# Patient Record
Sex: Female | Born: 1970 | Hispanic: Yes | Marital: Single | Smoking: Never smoker
Health system: Southern US, Community
[De-identification: ages and names within clinical notes are randomized; demographics above are authoritative.]

## PROBLEM LIST (undated history)

## (undated) DIAGNOSIS — I10 Essential (primary) hypertension: Secondary | ICD-10-CM

## (undated) DIAGNOSIS — K529 Noninfective gastroenteritis and colitis, unspecified: Secondary | ICD-10-CM

## (undated) DIAGNOSIS — D649 Anemia, unspecified: Secondary | ICD-10-CM

## (undated) DIAGNOSIS — J189 Pneumonia, unspecified organism: Secondary | ICD-10-CM

## (undated) DIAGNOSIS — R002 Palpitations: Secondary | ICD-10-CM

## (undated) DIAGNOSIS — R7303 Prediabetes: Secondary | ICD-10-CM

## (undated) DIAGNOSIS — F419 Anxiety disorder, unspecified: Secondary | ICD-10-CM

## (undated) DIAGNOSIS — R200 Anesthesia of skin: Secondary | ICD-10-CM

---

## 2020-02-29 DIAGNOSIS — A409 Streptococcal sepsis, unspecified: Secondary | ICD-10-CM

## 2020-02-29 HISTORY — DX: Streptococcal sepsis, unspecified: A40.9

## 2020-03-02 ENCOUNTER — Emergency Department (HOSPITAL_COMMUNITY)
Admission: EM | Admit: 2020-03-02 | Discharge: 2020-03-02 | Disposition: A | Discharge status: Home / Self Care | Payer: Self-pay | Attending: Emergency Medicine | Admitting: Emergency Medicine

## 2020-03-02 ENCOUNTER — Emergency Department (HOSPITAL_COMMUNITY): Payer: Self-pay

## 2020-03-02 ENCOUNTER — Encounter (HOSPITAL_COMMUNITY): Payer: Self-pay

## 2020-03-02 DIAGNOSIS — Z79899 Other long term (current) drug therapy: Secondary | ICD-10-CM | POA: Insufficient documentation

## 2020-03-02 DIAGNOSIS — Z7982 Long term (current) use of aspirin: Secondary | ICD-10-CM | POA: Insufficient documentation

## 2020-03-02 DIAGNOSIS — N201 Calculus of ureter: Secondary | ICD-10-CM | POA: Insufficient documentation

## 2020-03-02 HISTORY — DX: Noninfective gastroenteritis and colitis, unspecified: K52.9

## 2020-03-02 LAB — COMPREHENSIVE METABOLIC PANEL
ALT: 16 U/L (ref 0–44)
AST: 26 U/L (ref 15–41)
Albumin: 3.6 g/dL (ref 3.5–5.0)
Alkaline Phosphatase: 68 U/L (ref 38–126)
Anion gap: 14 (ref 5–15)
BUN: 10 mg/dL (ref 6–20)
CO2: 23 mmol/L (ref 22–32)
Calcium: 8.9 mg/dL (ref 8.9–10.3)
Chloride: 100 mmol/L (ref 98–111)
Creatinine, Ser: 0.75 mg/dL (ref 0.44–1.00)
GFR calc Af Amer: >60 mL/min (ref >60)
GFR calc non Af Amer: >60 mL/min (ref >60)
Glucose, Bld: 133 mg/dL — ABNORMAL HIGH (ref 70–99)
Potassium: 3.4 mmol/L — ABNORMAL LOW (ref 3.5–5.1)
Sodium: 137 mmol/L (ref 135–145)
Total Bilirubin: 0.6 mg/dL (ref 0.3–1.2)
Total Protein: 7.6 g/dL (ref 6.5–8.1)

## 2020-03-02 LAB — I-STAT BETA HCG BLOOD, ED (MC, WL, AP ONLY): I-stat hCG, quantitative: <5 m[IU]/mL (ref <5)

## 2020-03-02 LAB — CBC
HCT: 35.5 % — ABNORMAL LOW (ref 36.0–46.0)
Hemoglobin: 11.2 g/dL — ABNORMAL LOW (ref 12.0–15.0)
MCH: 25.5 pg — ABNORMAL LOW (ref 26.0–34.0)
MCHC: 31.5 g/dL (ref 30.0–36.0)
MCV: 80.9 fL (ref 80.0–100.0)
Platelets: 313 10*3/uL (ref 150–400)
RBC: 4.39 MIL/uL (ref 3.87–5.11)
RDW: 17.1 % — ABNORMAL HIGH (ref 11.5–15.5)
WBC: 10.7 10*3/uL — ABNORMAL HIGH (ref 4.0–10.5)
nRBC: 0 % (ref 0.0–0.2)

## 2020-03-02 LAB — URINALYSIS, ROUTINE W REFLEX MICROSCOPIC
Bilirubin Urine: NEGATIVE
Glucose, UA: NEGATIVE mg/dL
Ketones, ur: NEGATIVE mg/dL
Nitrite: NEGATIVE
Protein, ur: 30 mg/dL — AB
Specific Gravity, Urine: 1.015 (ref 1.005–1.030)
pH: 8 (ref 5.0–8.0)

## 2020-03-02 LAB — URINALYSIS, MICROSCOPIC (REFLEX): RBC / HPF: >50 RBC/hpf (ref 0–5)

## 2020-03-02 LAB — LIPASE, BLOOD: Lipase: 20 U/L (ref 11–51)

## 2020-03-02 MED ORDER — KETOROLAC TROMETHAMINE 15 MG/ML IJ SOLN
15.0000 mg | Freq: Once | INTRAMUSCULAR | Status: AC
  Administered 2020-03-02: 15 mg via INTRAVENOUS
  Filled 2020-03-02: qty 1

## 2020-03-02 MED ORDER — MORPHINE SULFATE (PF) 4 MG/ML IV SOLN
4.0000 mg | Freq: Once | INTRAVENOUS | Status: AC
  Administered 2020-03-02: 4 mg via INTRAVENOUS
  Filled 2020-03-02: qty 1

## 2020-03-02 MED ORDER — IOHEXOL 300 MG/ML  SOLN
100.0000 mL | Freq: Once | INTRAMUSCULAR | Status: AC | PRN
  Administered 2020-03-02: 100 mL via INTRAVENOUS

## 2020-03-02 MED ORDER — ONDANSETRON HCL 4 MG/2ML IJ SOLN
4.0000 mg | Freq: Once | INTRAMUSCULAR | Status: AC
  Administered 2020-03-02: 4 mg via INTRAVENOUS
  Filled 2020-03-02: qty 2

## 2020-03-02 MED ORDER — CEPHALEXIN 500 MG PO CAPS
500.0000 mg | ORAL_CAPSULE | Freq: Four times a day (QID) | ORAL | 0 refills | Status: DC
Start: 2020-03-02 — End: 2020-03-07

## 2020-03-02 MED ORDER — OXYCODONE-ACETAMINOPHEN 5-325 MG PO TABS: 2.0000 | ORAL_TABLET | ORAL | 0 refills | Status: AC | PRN

## 2020-03-02 MED ORDER — TAMSULOSIN HCL 0.4 MG PO CAPS: 0.4000 mg | ORAL_CAPSULE | Freq: Every day | ORAL | 0 refills | Status: AC

## 2020-03-02 MED ORDER — ONDANSETRON 4 MG PO TBDP
4.0000 mg | ORAL_TABLET | Freq: Three times a day (TID) | ORAL | 0 refills | Status: AC | PRN
Start: 2020-03-02 — End: ?

## 2020-03-02 NOTE — ED Provider Notes (Signed)
[facility] EMERGENCY DEPARTMENT Provider Note   CSN: [id number] Arrival date & time: 03/02/20  0946     History Chief Complaint  Patient presents with  . Abdominal Pain    Meghan [name] is a 49 y.o. female.  49 y/o F with hx of colitis presenting to the ER for 2 days of worsening LLQ pain and L flank pain with n/v. Denies fever, chills, cough. Meghan [name] reports hx of the same in the past and was told Meghan [name] had colitis. Meghan [name] is not having diarrhea. Reports being on her normal menstrual cycle. No dysuria and unclear if hematuria given her menstrual cycle.         Past Medical History:  Diagnosis Date  . Colitis     There are no problems to display for this patient.   History reviewed. No pertinent surgical history.   OB History   No obstetric history on file.     No family history on file.  Social History   Tobacco Use  . Smoking status: Not on file  Substance Use Topics  . Alcohol use: Not on file  . Drug use: Not on file    Home Medications Prior to Admission medications   Medication Sig Start Date End Date Taking? Authorizing Provider  aspirin EC 81 MG tablet Take 81 mg by mouth daily.   Yes [provider]  bisoprolol (ZEBETA) 10 MG tablet Take 10 mg by mouth daily.   Yes [provider]  clonazePAM (KLONOPIN) 2 MG tablet Take 2 mg by mouth 2 (two) times daily.   Yes [provider]  losartan-hydrochlorothiazide (HYZAAR) 100-25 MG tablet Take 1 tablet by mouth daily.   Yes [provider]  omeprazole (PRILOSEC) 20 MG capsule Take 20 mg by mouth daily.   Yes [provider]  OVER THE COUNTER MEDICATION Take 1 tablet by mouth daily. Enantyum Plus   Yes [provider]  pantoprazole (PROTONIX) 20 MG tablet Take 20 mg by mouth daily.   Yes [provider]  vitamin B-12 (CYANOCOBALAMIN) 500 MCG tablet Take 500 mcg by mouth daily.   Yes [provider]    Allergies    Patient has  no known allergies.  Review of Systems   Review of Systems  Constitutional: Negative for appetite change, chills and fever.  HENT: Negative for congestion.   Respiratory: Negative for cough and shortness of breath.   Cardiovascular: Negative for chest pain.  Gastrointestinal: Positive for abdominal pain, nausea and vomiting. Negative for constipation and diarrhea.  Genitourinary: Positive for vaginal bleeding (reoprts on her normal menstration currently). Negative for decreased urine volume, dysuria, genital sores, hematuria, menstrual problem, pelvic pain and vaginal discharge.  Musculoskeletal: Negative for back pain.  Skin: Negative for rash.  Neurological: Negative for dizziness.  All other systems reviewed and are negative.   Physical Exam Updated Vital Signs BP 126/61   Pulse 93   Temp 98.6 F (37 C) (Oral)   Resp 18   Ht 5\' 4"  (1.626 m)   Wt 83.9 kg   LMP 03/02/2020   SpO2 (!) 87%   BMI 31.76 kg/m   Physical Exam Vitals and nursing note reviewed.  Constitutional:      General: Meghan [name] is not in acute distress.    Appearance: Normal appearance. Meghan [name] is well-developed. Meghan [name] is not ill-appearing, toxic-appearing or diaphoretic.  HENT:     Head: Normocephalic and atraumatic. No raccoon eyes, Battle's sign, abrasion, contusion, masses or laceration.  Jaw: There is normal jaw occlusion.     Nose: Nose normal.     Mouth/Throat:     Mouth: Mucous membranes are moist.  Eyes:     Conjunctiva/sclera: Conjunctivae normal.  Neck:     Trachea: Trachea normal.  Cardiovascular:     Rate and Rhythm: Normal rate and regular rhythm.  Pulmonary:     Effort: Pulmonary effort is normal.     Breath sounds: Normal breath sounds.  Chest:     Chest wall: No deformity or tenderness.     Comments: No seatbelt sign Abdominal:     General: Abdomen is flat.     Palpations: Abdomen is soft.     Tenderness: There is abdominal tenderness in the left lower quadrant. There is guarding. There  is no right CVA tenderness, left CVA tenderness or rebound. Negative signs include Murphy's sign and Rovsing's sign.     Comments: No seatbelt sign  Musculoskeletal:     Cervical back: Full passive range of motion without pain. No edema. No pain with movement, spinous process tenderness or muscular tenderness.     Thoracic back: Normal.     Lumbar back: Normal.  Skin:    General: Skin is warm and dry.  Neurological:     General: No focal deficit present.     Mental Status: Meghan [name] is alert and oriented to person, place, and time.  Psychiatric:        Mood and Affect: Mood normal.     ED Results / Procedures / Treatments   Labs (all labs ordered are listed, but only abnormal results are displayed) Labs Reviewed  COMPREHENSIVE METABOLIC PANEL - Abnormal; Notable for the following components:      Result Value   Potassium 3.4 (*)    Glucose, Bld 133 (*)    All other components within normal limits  CBC - Abnormal; Notable for the following components:   WBC 10.7 (*)    Hemoglobin 11.2 (*)    HCT 35.5 (*)    MCH 25.5 (*)    RDW 17.1 (*)    All other components within normal limits  URINALYSIS, ROUTINE W REFLEX MICROSCOPIC - Abnormal; Notable for the following components:   APPearance TURBID (*)    Hgb urine dipstick LARGE (*)    Protein, ur 30 (*)    Leukocytes,Ua MODERATE (*)    All other components within normal limits  URINALYSIS, MICROSCOPIC (REFLEX) - Abnormal; Notable for the following components:   Bacteria, UA FEW (*)    All other components within normal limits  LIPASE, BLOOD  I-STAT BETA HCG BLOOD, ED ([facility], [facility], [facility] ONLY)    EKG None  Radiology No results found.  Procedures Procedures (including critical care time)  Medications Ordered in ED Medications  iohexol (OMNIPAQUE) 300 MG/ML solution 100 mL (has no administration in time range)  morphine 4 MG/ML injection 4 mg (4 mg Intravenous Given 03/02/20 1212)  ondansetron (ZOFRAN) injection 4 mg (4 mg Intravenous  Given 03/02/20 1236)  morphine 4 MG/ML injection 4 mg (4 mg Intravenous Given 03/02/20 1328)  ketorolac (TORADOL) 15 MG/ML injection 15 mg (15 mg Intravenous Given 03/02/20 1328)    ED Course  I have reviewed the triage vital signs and the nursing notes.  Pertinent labs & imaging results that were available during my care of the patient were reviewed by me and considered in my medical decision making (see chart for details).  Clinical Course as of Mar 03 1515  Thu  Mar 02, 2020  1427 Patient with history of colitis presenting with the same.  Afebrile but does appear uncomfortable and is actively vomiting.  Has left lower quadrant tenderness.  Meghan [name] was improved with morphine and Zofran.  Work-up revealing questionable UTI.  Has hemoglobin and red blood cells in her urine but it is also on her normal menstrual cycle.  No pelvic pain.  Awaiting CT scan.   [KM]  9150 Care signed to [facility] due to change of shift   [KM]    Clinical Course User Index [KM] Kristine Royal   MDM Rules/Calculators/A&P                       Final Clinical Impression(s) / ED Diagnoses Final diagnoses:  None    Rx / DC Orders ED Discharge Orders    None       Kristine Royal 03/02/20 1516    Veryl Speak, MD 03/03/20 [phone]

## 2020-03-02 NOTE — ED Provider Notes (Signed)
Accepted handoff at shift change from [facility]. Please see prior provider note for more detail.   Briefly: Patient is 49 y.o. patient is a 49 year old female presented today with 2 days of left-sided abdominal pain.  She also endorses flank pain and nausea and vomiting.  Denies any fevers chills cough cold congestion.  She is a history of similar episode that was found to be colitis.  No history of kidney stones.  History of pyelonephritis.  DDX: concern for pyelonephritis, diverticulitis, colitis, kidney stone, [place]  Plan: Follow-up on CT scan.    Physical Exam  BP 117/63   Pulse 97   Temp 98.6 F (37 C) (Oral)   Resp 18   Ht 5\' 4"  (1.626 m)   Wt 83.9 kg   LMP 03/02/2020   SpO2 96%   BMI 31.76 kg/m   CONSTITUTIONAL:  well-appearing, appears very uncomfortable NEURO:  Alert and oriented x 3, no focal deficits EYES:  pupils equal and reactive ENT/NECK:  trachea midline, no JVD CARDIO:  reg rate at rate of 100, reg rhythm, well-perfused PULM:  None labored breathing GI/GU:  Abdomen non-distended, significant tenderness to palpation of left lower quadrant and no CVA tenderness. MSK/SPINE:  No gross deformities, no edema SKIN:  no rash obvious, atraumatic, no ecchymosis  PSYCH:  Appropriate speech and behavior  ED Course/Procedures   Clinical Course as of Mar 03 1751  Thu Mar 02, 2020  1427 Patient with history of colitis presenting with the same.  Afebrile but does appear uncomfortable and is actively vomiting.  Has left lower quadrant tenderness.  She was improved with morphine and Zofran.  Work-up revealing questionable UTI.  Has hemoglobin and red blood cells in her urine but it is also on her normal menstrual cycle.  No pelvic pain.  Awaiting CT scan.   [KM]  5427 Care signed to [facility] due to change of shift   [KM]    Clinical Course User Index [KM] Alveria Apley, PA-C    Procedures Results for orders placed or performed during the hospital encounter of  03/02/20  Lipase, blood  Result Value Ref Range   Lipase 20 11 - 51 U/L  Comprehensive metabolic panel  Result Value Ref Range   Sodium 137 135 - 145 mmol/L   Potassium 3.4 (L) 3.5 - 5.1 mmol/L   Chloride 100 98 - 111 mmol/L   CO2 23 22 - 32 mmol/L   Glucose, Bld 133 (H) 70 - 99 mg/dL   BUN 10 6 - 20 mg/dL   Creatinine, Ser 0.75 0.44 - 1.00 mg/dL   Calcium 8.9 8.9 - 10.3 mg/dL   Total Protein 7.6 6.5 - 8.1 g/dL   Albumin 3.6 3.5 - 5.0 g/dL   AST 26 15 - 41 U/L   ALT 16 0 - 44 U/L   Alkaline Phosphatase 68 38 - 126 U/L   Total Bilirubin 0.6 0.3 - 1.2 mg/dL   GFR calc non Af Amer >60 >60 mL/min   GFR calc Af Amer >60 >60 mL/min   Anion gap 14 5 - 15  CBC  Result Value Ref Range   WBC 10.7 (H) 4.0 - 10.5 K/uL   RBC 4.39 3.87 - 5.11 MIL/uL   Hemoglobin 11.2 (L) 12.0 - 15.0 g/dL   HCT 35.5 (L) 36.0 - 46.0 %   MCV 80.9 80.0 - 100.0 fL   MCH 25.5 (L) 26.0 - 34.0 pg   MCHC 31.5 30.0 - 36.0 g/dL  RDW 17.1 (H) 11.5 - 15.5 %   Platelets 313 150 - 400 K/uL   nRBC 0.0 0.0 - 0.2 %  Urinalysis, Routine w reflex microscopic  Result Value Ref Range   Color, Urine PINK YELLOW   APPearance TURBID (A) CLEAR   Specific Gravity, Urine 1.015 1.005 - 1.030   pH 8.0 5.0 - 8.0   Glucose, UA NEGATIVE NEGATIVE mg/dL   Hgb urine dipstick LARGE (A) NEGATIVE   Bilirubin Urine NEGATIVE NEGATIVE   Ketones, ur NEGATIVE NEGATIVE mg/dL   Protein, ur 30 (A) NEGATIVE mg/dL   Nitrite NEGATIVE NEGATIVE   Leukocytes,Ua MODERATE (A) NEGATIVE  Urinalysis, Microscopic (reflex)  Result Value Ref Range   RBC / HPF >50 0 - 5 RBC/hpf   WBC, UA 21-50 0 - 5 WBC/hpf   Bacteria, UA FEW (A) NONE SEEN   Squamous Epithelial / LPF 0-5 0 - 5  I-Stat beta hCG blood, ED  Result Value Ref Range   I-stat hCG, quantitative <5.0 <5 mIU/mL   Comment 3           CT ABDOMEN PELVIS W CONTRAST  Result Date: 03/02/2020 CLINICAL DATA:  Right lower quadrant abdominal pain radiating to the back for few days. EXAM: CT ABDOMEN  AND PELVIS WITH CONTRAST TECHNIQUE: Multidetector CT imaging of the abdomen and pelvis was performed using the standard protocol following bolus administration of intravenous contrast. CONTRAST:  OMNIPAQUE IOHEXOL 300 MG/ML  SOLN COMPARISON:  None. FINDINGS: Lower chest: No significant pulmonary nodules or acute consolidative airspace disease. Hepatobiliary: Normal liver size. No liver mass. Normal gallbladder with no radiopaque cholelithiasis. No biliary ductal dilatation. Pancreas: Normal, with no mass or duct dilation. Spleen: Normal size. No mass. Adrenals/Urinary Tract: Normal adrenals. Asymmetrically delayed left contrast nephrogram with left perinephric fat stranding and ill-defined fluid. Obstructing 4 mm left ureterovesical junction stone with mild left hydroureteronephrosis. No renal masses. No right hydronephrosis. Normal bladder. Stomach/Bowel: Normal non-distended stomach. Normal caliber small bowel with no small bowel wall thickening. Normal appendix. Normal large bowel with no diverticulosis, large bowel wall thickening or pericolonic fat stranding. Vascular/Lymphatic: Normal caliber abdominal aorta. Patent portal, splenic, hepatic and renal veins. No pathologically enlarged lymph nodes in the abdomen or pelvis. Reproductive: Mildly enlarged and mildly heterogeneous uterus. Simple 1.2 cm left adnexal cyst (series 3/image 74), requiring no follow-up. No right adnexal mass. Other: No pneumoperitoneum, ascites or focal fluid collection. Musculoskeletal: No aggressive appearing focal osseous lesions. Minimal thoracolumbar spondylosis. IMPRESSION: Obstructing 4 mm left UVJ stone with mild left hydroureteronephrosis. Electronically Signed   By: Delbert Phenix M.D.   On: 03/02/2020 16:02    MDM   Patient past medical history and HPI PE above.  Patient has negative for pregnancy.  Lipase within normal limits doubt pancreatitis.  CBC with mild leukocytosis.  Likely secondary to his emesis.  No  significant anemia.  CMP with no sniffing electrolyte abnormalities.  UA does not appear acutely affected however given that she has a kidney stone will treat empirically with antibiotics in the form of Keflex.   I discussed this case with my attending physician who cosigned this note including patient's presenting symptoms, physical exam, and planned diagnostics and interventions. Attending physician stated agreement with plan or made changes to plan which were implemented.   5:52 PM discussed with mark ehlers of urology recommended antibiotic and close follow-up with [facility] tomorrow or Monday.  Patient discharged with Zofran, Percocet, Tylenol Profen recommendations, urine strainer, close urology follow-up tomorrow or  Monday.   Solon Augusta [place], [place] 03/02/20 2119    Alvira Monday, MD 03/03/20 1323

## 2020-03-02 NOTE — ED Notes (Signed)
Pt ambulated to bathroom w standby assist.

## 2020-03-02 NOTE — ED Triage Notes (Signed)
Spanish interpreter used for triage:  Pt reports RLQ pain that radiates to her back for the past few days, hx of colitis. Denies any blood in her stools. Some nausea.

## 2020-03-02 NOTE — Discharge Instructions (Signed)
Please call tomorrow morning to the urology clinic.  They will see you tomorrow or Monday.  Please take antibiotics as prescribed.  Please use Zofran for nausea as needed.  Dissolved under the tongue.  Please use Tylenol or ibuprofen for pain.  You may use 600 mg ibuprofen every 6 hours or 1000 mg of Tylenol every 6 hours.  You may choose to alternate between the 2.  This would be most effective.  Not to exceed 4 g of Tylenol within 24 hours.  Not to exceed 3200 mg ibuprofen 24 hours. Please also use Percocet for pain as needed.

## 2020-03-03 ENCOUNTER — Telehealth: Payer: Self-pay | Admitting: Urology

## 2020-03-03 ENCOUNTER — Inpatient Hospital Stay (HOSPITAL_COMMUNITY): Payer: Self-pay

## 2020-03-03 ENCOUNTER — Inpatient Hospital Stay (HOSPITAL_COMMUNITY)
Admission: EM | Admit: 2020-03-03 | Discharge: 2020-03-07 | DRG: 853 | Disposition: A | Discharge status: Home / Self Care | Payer: Self-pay | Attending: Internal Medicine | Admitting: Internal Medicine

## 2020-03-03 ENCOUNTER — Inpatient Hospital Stay (HOSPITAL_COMMUNITY): Payer: Self-pay | Admitting: Certified Registered Nurse Anesthetist

## 2020-03-03 ENCOUNTER — Encounter (HOSPITAL_COMMUNITY): Payer: Self-pay | Admitting: Emergency Medicine

## 2020-03-03 ENCOUNTER — Emergency Department (HOSPITAL_COMMUNITY): Payer: Self-pay

## 2020-03-03 ENCOUNTER — Encounter (HOSPITAL_COMMUNITY)
Admission: EM | Disposition: A | Discharge status: Home / Self Care | Payer: Self-pay | Source: Home / Self Care | Attending: Internal Medicine

## 2020-03-03 ENCOUNTER — Other Ambulatory Visit: Payer: Self-pay

## 2020-03-03 DIAGNOSIS — E876 Hypokalemia: Secondary | ICD-10-CM | POA: Insufficient documentation

## 2020-03-03 DIAGNOSIS — Z7982 Long term (current) use of aspirin: Secondary | ICD-10-CM

## 2020-03-03 DIAGNOSIS — A419 Sepsis, unspecified organism: Secondary | ICD-10-CM | POA: Diagnosis present

## 2020-03-03 DIAGNOSIS — N136 Pyonephrosis: Secondary | ICD-10-CM | POA: Diagnosis present

## 2020-03-03 DIAGNOSIS — J810 Acute pulmonary edema: Secondary | ICD-10-CM | POA: Diagnosis not present

## 2020-03-03 DIAGNOSIS — N179 Acute kidney failure, unspecified: Secondary | ICD-10-CM | POA: Diagnosis present

## 2020-03-03 DIAGNOSIS — B9620 Unspecified Escherichia coli [E. coli] as the cause of diseases classified elsewhere: Secondary | ICD-10-CM | POA: Diagnosis present

## 2020-03-03 DIAGNOSIS — I10 Essential (primary) hypertension: Secondary | ICD-10-CM | POA: Diagnosis present

## 2020-03-03 DIAGNOSIS — Z79899 Other long term (current) drug therapy: Secondary | ICD-10-CM

## 2020-03-03 DIAGNOSIS — D509 Iron deficiency anemia, unspecified: Secondary | ICD-10-CM | POA: Diagnosis present

## 2020-03-03 DIAGNOSIS — Z419 Encounter for procedure for purposes other than remedying health state, unspecified: Secondary | ICD-10-CM

## 2020-03-03 DIAGNOSIS — R6521 Severe sepsis with septic shock: Secondary | ICD-10-CM

## 2020-03-03 DIAGNOSIS — J9601 Acute respiratory failure with hypoxia: Secondary | ICD-10-CM | POA: Diagnosis not present

## 2020-03-03 DIAGNOSIS — K219 Gastro-esophageal reflux disease without esophagitis: Secondary | ICD-10-CM | POA: Diagnosis present

## 2020-03-03 DIAGNOSIS — A4189 Other specified sepsis: Principal | ICD-10-CM | POA: Diagnosis present

## 2020-03-03 DIAGNOSIS — B964 Proteus (mirabilis) (morganii) as the cause of diseases classified elsewhere: Secondary | ICD-10-CM | POA: Diagnosis present

## 2020-03-03 DIAGNOSIS — A409 Streptococcal sepsis, unspecified: Secondary | ICD-10-CM

## 2020-03-03 DIAGNOSIS — E872 Acidosis: Secondary | ICD-10-CM | POA: Diagnosis present

## 2020-03-03 DIAGNOSIS — R6520 Severe sepsis without septic shock: Secondary | ICD-10-CM | POA: Diagnosis present

## 2020-03-03 DIAGNOSIS — Z20822 Contact with and (suspected) exposure to covid-19: Secondary | ICD-10-CM | POA: Diagnosis present

## 2020-03-03 HISTORY — DX: Essential (primary) hypertension: I10

## 2020-03-03 HISTORY — PX: CYSTOSCOPY WITH RETROGRADE PYELOGRAM, URETEROSCOPY AND STENT PLACEMENT: SHX5789

## 2020-03-03 LAB — BASIC METABOLIC PANEL
Anion gap: 19 — ABNORMAL HIGH (ref 5–15)
BUN: 24 mg/dL — ABNORMAL HIGH (ref 6–20)
CO2: 18 mmol/L — ABNORMAL LOW (ref 22–32)
Calcium: 8.2 mg/dL — ABNORMAL LOW (ref 8.9–10.3)
Chloride: 98 mmol/L (ref 98–111)
Creatinine, Ser: 2.28 mg/dL — ABNORMAL HIGH (ref 0.44–1.00)
GFR calc Af Amer: 28 mL/min — ABNORMAL LOW (ref >60)
GFR calc non Af Amer: 24 mL/min — ABNORMAL LOW (ref >60)
Glucose, Bld: 126 mg/dL — ABNORMAL HIGH (ref 70–99)
Potassium: 2.9 mmol/L — ABNORMAL LOW (ref 3.5–5.1)
Sodium: 135 mmol/L (ref 135–145)

## 2020-03-03 LAB — CBC WITH DIFFERENTIAL/PLATELET
Abs Immature Granulocytes: 0.1 10*3/uL — ABNORMAL HIGH (ref 0.00–0.07)
Basophils Absolute: 0 10*3/uL (ref 0.0–0.1)
Basophils Relative: 0 %
Eosinophils Absolute: 0.1 10*3/uL (ref 0.0–0.5)
Eosinophils Relative: 1 %
HCT: 35.9 % — ABNORMAL LOW (ref 36.0–46.0)
Hemoglobin: 11.1 g/dL — ABNORMAL LOW (ref 12.0–15.0)
Lymphocytes Relative: 9 %
Lymphs Abs: 1.1 10*3/uL (ref 0.7–4.0)
MCH: 25.3 pg — ABNORMAL LOW (ref 26.0–34.0)
MCHC: 30.9 g/dL (ref 30.0–36.0)
MCV: 81.8 fL (ref 80.0–100.0)
Monocytes Absolute: 0.4 10*3/uL (ref 0.1–1.0)
Monocytes Relative: 3 %
Myelocytes: 1 %
Neutro Abs: 10.5 10*3/uL — ABNORMAL HIGH (ref 1.7–7.7)
Neutrophils Relative %: 86 %
Platelets: 168 10*3/uL (ref 150–400)
RBC: 4.39 MIL/uL (ref 3.87–5.11)
RDW: 17.4 % — ABNORMAL HIGH (ref 11.5–15.5)
WBC: 12.2 10*3/uL — ABNORMAL HIGH (ref 4.0–10.5)
nRBC: 0.2 % (ref 0.0–0.2)
nRBC: 2 /100 WBC — ABNORMAL HIGH

## 2020-03-03 LAB — I-STAT BETA HCG BLOOD, ED (MC, WL, AP ONLY): I-stat hCG, quantitative: <5 m[IU]/mL (ref <5)

## 2020-03-03 LAB — PROTIME-INR
INR: 1.6 — ABNORMAL HIGH (ref 0.8–1.2)
Prothrombin Time: 18.6 seconds — ABNORMAL HIGH (ref 11.4–15.2)

## 2020-03-03 LAB — LACTIC ACID, PLASMA
Lactic Acid, Venous: 4.8 mmol/L (ref 0.5–1.9)
Lactic Acid, Venous: 4.6 mmol/L (ref 0.5–1.9)

## 2020-03-03 LAB — SARS CORONAVIRUS 2 BY RT PCR (HOSPITAL ORDER, PERFORMED IN ~~LOC~~ HOSPITAL LAB): SARS Coronavirus 2: NEGATIVE

## 2020-03-03 LAB — APTT: aPTT: 39 seconds — ABNORMAL HIGH (ref 24–36)

## 2020-03-03 SURGERY — CYSTOURETEROSCOPY, WITH RETROGRADE PYELOGRAM AND STENT INSERTION
Anesthesia: General | Site: Bladder | Laterality: Left

## 2020-03-03 MED ORDER — LACTATED RINGERS IV SOLN: INTRAVENOUS | Status: DC

## 2020-03-03 MED ORDER — ONDANSETRON HCL 4 MG/2ML IJ SOLN
4.0000 mg | Freq: Once | INTRAMUSCULAR | Status: AC
  Administered 2020-03-03: 4 mg via INTRAVENOUS
  Filled 2020-03-03: qty 2

## 2020-03-03 MED ORDER — FENTANYL CITRATE (PF) 250 MCG/5ML IJ SOLN
INTRAMUSCULAR | Status: AC
  Filled 2020-03-03: qty 5

## 2020-03-03 MED ORDER — IOHEXOL 300 MG/ML  SOLN
INTRAMUSCULAR | Status: DC | PRN
  Administered 2020-03-03: 20 mL

## 2020-03-03 MED ORDER — MIDAZOLAM HCL 2 MG/2ML IJ SOLN
INTRAMUSCULAR | Status: AC
  Filled 2020-03-03: qty 2

## 2020-03-03 MED ORDER — FENTANYL CITRATE (PF) 100 MCG/2ML IJ SOLN
INTRAMUSCULAR | Status: DC | PRN
  Administered 2020-03-03: 50 ug via INTRAVENOUS

## 2020-03-03 MED ORDER — LACTATED RINGERS IV BOLUS (SEPSIS)
1000.0000 mL | Freq: Once | INTRAVENOUS | Status: AC
  Administered 2020-03-03: 1000 mL via INTRAVENOUS

## 2020-03-03 MED ORDER — PROPOFOL 10 MG/ML IV BOLUS
INTRAVENOUS | Status: DC | PRN
  Administered 2020-03-03: 100 mg via INTRAVENOUS

## 2020-03-03 MED ORDER — DEXAMETHASONE SODIUM PHOSPHATE 10 MG/ML IJ SOLN
INTRAMUSCULAR | Status: DC | PRN
  Administered 2020-03-03: 5 mg via INTRAVENOUS

## 2020-03-03 MED ORDER — PHENYLEPHRINE 40 MCG/ML (10ML) SYRINGE FOR IV PUSH (FOR BLOOD PRESSURE SUPPORT)
PREFILLED_SYRINGE | INTRAVENOUS | Status: DC | PRN
  Administered 2020-03-03 (×2): 120 ug via INTRAVENOUS

## 2020-03-03 MED ORDER — POTASSIUM CHLORIDE 10 MEQ/100ML IV SOLN
10.0000 meq | INTRAVENOUS | Status: AC
  Administered 2020-03-03 (×2): 10 meq via INTRAVENOUS
  Filled 2020-03-03 (×2): qty 100

## 2020-03-03 MED ORDER — HYDROMORPHONE HCL 1 MG/ML IJ SOLN: 0.5000 mg | INTRAMUSCULAR | Status: DC | PRN

## 2020-03-03 MED ORDER — MEPERIDINE HCL 25 MG/ML IJ SOLN: 6.2500 mg | INTRAMUSCULAR | Status: DC | PRN

## 2020-03-03 MED ORDER — ALBUMIN HUMAN 5 % IV SOLN
INTRAVENOUS | Status: AC
  Filled 2020-03-03: qty 250

## 2020-03-03 MED ORDER — PROMETHAZINE HCL 25 MG/ML IJ SOLN: 6.2500 mg | INTRAMUSCULAR | Status: DC | PRN

## 2020-03-03 MED ORDER — OXYCODONE HCL 5 MG/5ML PO SOLN: 5.0000 mg | Freq: Once | ORAL | Status: DC | PRN

## 2020-03-03 MED ORDER — LIDOCAINE HCL (CARDIAC) PF 100 MG/5ML IV SOSY
PREFILLED_SYRINGE | INTRAVENOUS | Status: DC | PRN
  Administered 2020-03-03: 80 mg via INTRAVENOUS

## 2020-03-03 MED ORDER — OXYCODONE HCL 5 MG PO TABS: 5.0000 mg | ORAL_TABLET | Freq: Once | ORAL | Status: DC | PRN

## 2020-03-03 MED ORDER — ONDANSETRON HCL 4 MG/2ML IJ SOLN
INTRAMUSCULAR | Status: DC | PRN
  Administered 2020-03-03: 4 mg via INTRAVENOUS

## 2020-03-03 MED ORDER — 0.9 % SODIUM CHLORIDE (POUR BTL) OPTIME
TOPICAL | Status: DC | PRN
  Administered 2020-03-03: 1000 mL

## 2020-03-03 MED ORDER — SODIUM CHLORIDE 0.9 % IV SOLN: INTRAVENOUS | Status: DC

## 2020-03-03 MED ORDER — MIDAZOLAM HCL 2 MG/2ML IJ SOLN
INTRAMUSCULAR | Status: DC | PRN
  Administered 2020-03-03: 1 mg via INTRAVENOUS

## 2020-03-03 MED ORDER — ACETAMINOPHEN 650 MG RE SUPP: 650.0000 mg | Freq: Four times a day (QID) | RECTAL | Status: DC | PRN

## 2020-03-03 MED ORDER — ACETAMINOPHEN 650 MG RE SUPP
650.0000 mg | Freq: Once | RECTAL | Status: AC
  Administered 2020-03-03: 650 mg via RECTAL
  Filled 2020-03-03: qty 1

## 2020-03-03 MED ORDER — HYDROMORPHONE HCL 1 MG/ML IJ SOLN: 0.2500 mg | INTRAMUSCULAR | Status: DC | PRN

## 2020-03-03 MED ORDER — ACETAMINOPHEN 325 MG PO TABS
650.0000 mg | ORAL_TABLET | Freq: Four times a day (QID) | ORAL | Status: DC | PRN
  Administered 2020-03-04 – 2020-03-07 (×6): 650 mg via ORAL
  Filled 2020-03-03 (×6): qty 2

## 2020-03-03 MED ORDER — WATER FOR IRRIGATION, STERILE IR SOLN
Status: DC | PRN
  Administered 2020-03-03: 3000 mL via INTRAVESICAL

## 2020-03-03 MED ORDER — HEPARIN SODIUM (PORCINE) 5000 UNIT/ML IJ SOLN
5000.0000 [IU] | Freq: Three times a day (TID) | INTRAMUSCULAR | Status: DC
  Administered 2020-03-03 – 2020-03-07 (×10): 5000 [IU] via SUBCUTANEOUS
  Filled 2020-03-03 (×10): qty 1

## 2020-03-03 MED ORDER — ONDANSETRON HCL 4 MG/2ML IJ SOLN
4.0000 mg | Freq: Four times a day (QID) | INTRAMUSCULAR | Status: DC | PRN
  Administered 2020-03-04: 4 mg via INTRAVENOUS
  Filled 2020-03-03 (×2): qty 2

## 2020-03-03 MED ORDER — ALBUMIN HUMAN 5 % IV SOLN
12.5000 g | Freq: Once | INTRAVENOUS | Status: AC
  Administered 2020-03-03: 12.5 g via INTRAVENOUS

## 2020-03-03 MED ORDER — ONDANSETRON HCL 4 MG PO TABS: 4.0000 mg | ORAL_TABLET | Freq: Four times a day (QID) | ORAL | Status: DC | PRN

## 2020-03-03 MED ORDER — PHENYLEPHRINE HCL-NACL 10-0.9 MG/250ML-% IV SOLN
INTRAVENOUS | Status: DC | PRN
  Administered 2020-03-03: 50 ug/min via INTRAVENOUS

## 2020-03-03 MED ORDER — SODIUM CHLORIDE 0.9 % IV SOLN
1.0000 g | INTRAVENOUS | Status: DC
  Administered 2020-03-03 – 2020-03-04 (×2): 1 g via INTRAVENOUS
  Filled 2020-03-03: qty 1
  Filled 2020-03-03: qty 10

## 2020-03-03 MED ORDER — LIDOCAINE 2% (20 MG/ML) 5 ML SYRINGE
INTRAMUSCULAR | Status: AC
  Filled 2020-03-03: qty 5

## 2020-03-03 MED ORDER — PROMETHAZINE HCL 25 MG/ML IJ SOLN
12.5000 mg | Freq: Once | INTRAMUSCULAR | Status: AC
  Administered 2020-03-03: 12.5 mg via INTRAVENOUS
  Filled 2020-03-03: qty 1

## 2020-03-03 MED ORDER — PROPOFOL 10 MG/ML IV BOLUS
INTRAVENOUS | Status: AC
  Filled 2020-03-03: qty 20

## 2020-03-03 SURGICAL SUPPLY — 23 items
BAG URINE DRAIN 2000ML AR STRL (UROLOGICAL SUPPLIES) ×6 IMPLANT
BAG URO CATCHER STRL LF (MISCELLANEOUS) ×3 IMPLANT
CATH FOLEY 2WAY SLVR  5CC 16FR (CATHETERS) ×2
CATH FOLEY 2WAY SLVR 5CC 16FR (CATHETERS) ×1 IMPLANT
CATH URET 5FR 28IN OPEN ENDED (CATHETERS) ×3 IMPLANT
GLOVE BIO SURGEON STRL SZ 6.5 (GLOVE) ×2 IMPLANT
GLOVE BIO SURGEONS STRL SZ 6.5 (GLOVE) ×1
GOWN STRL REUS W/ TWL LRG LVL3 (GOWN DISPOSABLE) ×1 IMPLANT
GOWN STRL REUS W/ TWL XL LVL3 (GOWN DISPOSABLE) ×1 IMPLANT
GOWN STRL REUS W/TWL LRG LVL3 (GOWN DISPOSABLE) ×2
GOWN STRL REUS W/TWL XL LVL3 (GOWN DISPOSABLE) ×2
GUIDEWIRE ANG ZIPWIRE 038X150 (WIRE) ×3 IMPLANT
GUIDEWIRE STR DUAL SENSOR (WIRE) ×3 IMPLANT
KIT TURNOVER KIT B (KITS) ×3 IMPLANT
MANIFOLD NEPTUNE II (INSTRUMENTS) IMPLANT
NS IRRIG 1000ML POUR BTL (IV SOLUTION) IMPLANT
PACK CYSTO (CUSTOM PROCEDURE TRAY) ×3 IMPLANT
STENT URET 6FRX24 CONTOUR (STENTS) ×3 IMPLANT
STENT URET 6FRX26 CONTOUR (STENTS) IMPLANT
TOWEL GREEN STERILE FF (TOWEL DISPOSABLE) ×3 IMPLANT
TUBE CONNECTING 12'X1/4 (SUCTIONS)
TUBE CONNECTING 12X1/4 (SUCTIONS) IMPLANT
WATER STERILE IRR 3000ML UROMA (IV SOLUTION) ×3 IMPLANT

## 2020-03-03 NOTE — H&P (View-Only) (Signed)
I have been asked to see the patient by Dr. Meridee Score, for evaluation and management of left ureteral calculus with sepsis.  History of present illness: 49 year old Meghan [name] with a 4 mm left UVJ calculus that was seen in the ER yesterday and sent home with antibiotics returns with a fever of 103, tachycardia and hypotension consistent with septic stone.   Review of systems: A 12 point comprehensive review of systems was obtained and is negative unless otherwise stated in the history of present illness.  There are no problems to display for this patient.   No current facility-administered medications on file prior to encounter.   Current Outpatient Medications on File Prior to Encounter  Medication Sig Dispense Refill  . aspirin EC 81 MG tablet Take 81 mg by mouth daily.    . bisoprolol (ZEBETA) 10 MG tablet Take 10 mg by mouth daily.    . cephALEXin (KEFLEX) 500 MG capsule Take 1 capsule (500 mg total) by mouth 4 (four) times daily. 20 capsule 0  . clonazePAM (KLONOPIN) 2 MG tablet Take 2 mg by mouth 2 (two) times daily.    Marland Kitchen losartan-hydrochlorothiazide (HYZAAR) 100-25 MG tablet Take 1 tablet by mouth daily.    Marland Kitchen omeprazole (PRILOSEC) 20 MG capsule Take 20 mg by mouth daily.    . ondansetron (ZOFRAN ODT) 4 MG disintegrating tablet Take 1 tablet (4 mg total) by mouth every 8 (eight) hours as needed for nausea or vomiting. 20 tablet 0  . OVER THE COUNTER MEDICATION Take 1 tablet by mouth daily. Enantyum Plus    . oxyCODONE-acetaminophen (PERCOCET/ROXICET) 5-325 MG tablet Take 2 tablets by mouth every 4 (four) hours as needed for severe pain. 14 tablet 0  . pantoprazole (PROTONIX) 20 MG tablet Take 20 mg by mouth daily.    . tamsulosin (FLOMAX) 0.4 MG CAPS capsule Take 1 capsule (0.4 mg total) by mouth daily. 30 capsule 0  . vitamin B-12 (CYANOCOBALAMIN) 500 MCG tablet Take 500 mcg by mouth daily.      Past Medical History:  Diagnosis Date  . Colitis     History reviewed. No  pertinent surgical history.  Social History   Tobacco Use  . Smoking status: Not on file  Substance Use Topics  . Alcohol use: Not on file  . Drug use: Not on file    History reviewed. No pertinent family history.  PE: Vitals:   03/03/20 1110 03/03/20 1126 03/03/20 1148  BP: (!) 150/124 98/60 (!) 107/54  Pulse: (!) 138 (!) 130 (!) 128  Resp: (!) 24 (!) 22 (!) 27  Temp: (!) 103 F (39.4 C)    TempSrc: Oral    SpO2: 96% 92% 97%  Weight: 83.9 kg    Height: 5\' 4"  (1.626 m)     Patient appears to be in no acute distress  patient is alert and oriented x3 Atraumatic normocephalic head No cervical or supraclavicular lymphadenopathy appreciated No increased work of breathing Regular sinus rhythm/rate Abdomen is soft, nontender, nondistended, no CVA or suprapubic tenderness Lower extremities are symmetric without appreciable edema Grossly neurologically intact No identifiable skin lesions  Recent Labs    03/02/20 1017  WBC 10.7*  HGB 11.2*  HCT 35.5*   Recent Labs    03/02/20 1017 03/03/20 1142  NA 137 135  K 3.4* 2.9*  CL 100 98  CO2 23 18*  GLUCOSE 133* 126*  BUN 10 24*  CREATININE 0.75 2.28*  CALCIUM 8.9 8.2*   No results for input(s): LABPT,  INR in the last 72 hours. No results for input(s): LABURIN in the last 72 hours. No results found for this or any previous visit.  Imaging: CT Abd/Pelvis 03/02/20 CLINICAL DATA:  Right lower quadrant abdominal pain radiating to the back for few days.  EXAM: CT ABDOMEN AND PELVIS WITH CONTRAST  TECHNIQUE: Multidetector CT imaging of the abdomen and pelvis was performed using the standard protocol following bolus administration of intravenous contrast.  CONTRAST:  169mL OMNIPAQUE IOHEXOL 300 MG/ML  SOLN  COMPARISON:  None.  FINDINGS: Lower chest: No significant pulmonary nodules or acute consolidative airspace disease.  Hepatobiliary: Normal liver size. No liver mass. Normal gallbladder with no  radiopaque cholelithiasis. No biliary ductal dilatation.  Pancreas: Normal, with no mass or duct dilation.  Spleen: Normal size. No mass.  Adrenals/Urinary Tract: Normal adrenals. Asymmetrically delayed left contrast nephrogram with left perinephric fat stranding and ill-defined fluid. Obstructing 4 mm left ureterovesical junction stone with mild left hydroureteronephrosis. No renal masses. No right hydronephrosis. Normal bladder.  Stomach/Bowel: Normal non-distended stomach. Normal caliber small bowel with no small bowel wall thickening. Normal appendix. Normal large bowel with no diverticulosis, large bowel wall thickening or pericolonic fat stranding.  Vascular/Lymphatic: Normal caliber abdominal aorta. Patent portal, splenic, hepatic and renal veins. No pathologically enlarged lymph nodes in the abdomen or pelvis.  Reproductive: Mildly enlarged and mildly heterogeneous uterus. Simple 1.2 cm left adnexal cyst (series 3/image 74), requiring no follow-up. No right adnexal mass.  Other: No pneumoperitoneum, ascites or focal fluid collection.  Musculoskeletal: No aggressive appearing focal osseous lesions. Minimal thoracolumbar spondylosis.  IMPRESSION: Obstructing 4 mm left UVJ stone with mild left hydroureteronephrosis.   A/P: 49 year old Meghan [name] with 4 mm left UVJ calculus with mild hydronephrosis with clinical setting of sepsis.  -plan for urgent OR for cystoscopy with ureteral stent placement -admit to hospitalist for sepsis -Risks and benefits of urgent ureteral stent placement discussed with patient including bleeding, infection, pain, stent discomfort, need for staged procedure, inability to place stent, need for future procedures, damage to surrounding structures.  Informed consent obtained. -urine culture from yesterday shows 40,000 CFU's of E. coli susceptibilities pending  Thank you for involving me in this patient's care, I will continue to follow along.  Please page with any further questions or concerns. Almin Livingstone D Normand Damron

## 2020-03-03 NOTE — Discharge Instructions (Signed)

## 2020-03-03 NOTE — H&P (Signed)
I have been asked to see the patient by Dr. Meridee Score, for evaluation and management of left ureteral calculus with sepsis.  History of present illness: 49 year old woman with a 4 mm left UVJ calculus that was seen in the ER yesterday and sent home with antibiotics returns with a fever of 103, tachycardia and hypotension consistent with septic stone.   Review of systems: A 12 point comprehensive review of systems was obtained and is negative unless otherwise stated in the history of present illness.  There are no problems to display for this patient.   No current facility-administered medications on file prior to encounter.   Current Outpatient Medications on File Prior to Encounter  Medication Sig Dispense Refill  . aspirin EC 81 MG tablet Take 81 mg by mouth daily.    . bisoprolol (ZEBETA) 10 MG tablet Take 10 mg by mouth daily.    . cephALEXin (KEFLEX) 500 MG capsule Take 1 capsule (500 mg total) by mouth 4 (four) times daily. 20 capsule 0  . clonazePAM (KLONOPIN) 2 MG tablet Take 2 mg by mouth 2 (two) times daily.    Marland Kitchen losartan-hydrochlorothiazide (HYZAAR) 100-25 MG tablet Take 1 tablet by mouth daily.    Marland Kitchen omeprazole (PRILOSEC) 20 MG capsule Take 20 mg by mouth daily.    . ondansetron (ZOFRAN ODT) 4 MG disintegrating tablet Take 1 tablet (4 mg total) by mouth every 8 (eight) hours as needed for nausea or vomiting. 20 tablet 0  . OVER THE COUNTER MEDICATION Take 1 tablet by mouth daily. Enantyum Plus    . oxyCODONE-acetaminophen (PERCOCET/ROXICET) 5-325 MG tablet Take 2 tablets by mouth every 4 (four) hours as needed for severe pain. 14 tablet 0  . pantoprazole (PROTONIX) 20 MG tablet Take 20 mg by mouth daily.    . tamsulosin (FLOMAX) 0.4 MG CAPS capsule Take 1 capsule (0.4 mg total) by mouth daily. 30 capsule 0  . vitamin B-12 (CYANOCOBALAMIN) 500 MCG tablet Take 500 mcg by mouth daily.      Past Medical History:  Diagnosis Date  . Colitis     History reviewed. No  pertinent surgical history.  Social History   Tobacco Use  . Smoking status: Not on file  Substance Use Topics  . Alcohol use: Not on file  . Drug use: Not on file    History reviewed. No pertinent family history.  PE: Vitals:   03/03/20 1110 03/03/20 1126 03/03/20 1148  BP: (!) 150/124 98/60 (!) 107/54  Pulse: (!) 138 (!) 130 (!) 128  Resp: (!) 24 (!) 22 (!) 27  Temp: (!) 103 F (39.4 C)    TempSrc: Oral    SpO2: 96% 92% 97%  Weight: 83.9 kg    Height: 5\' 4"  (1.626 m)     Patient appears to be in no acute distress  patient is alert and oriented x3 Atraumatic normocephalic head No cervical or supraclavicular lymphadenopathy appreciated No increased work of breathing Regular sinus rhythm/rate Abdomen is soft, nontender, nondistended, no CVA or suprapubic tenderness Lower extremities are symmetric without appreciable edema Grossly neurologically intact No identifiable skin lesions  Recent Labs    03/02/20 1017  WBC 10.7*  HGB 11.2*  HCT 35.5*   Recent Labs    03/02/20 1017 03/03/20 1142  NA 137 135  K 3.4* 2.9*  CL 100 98  CO2 23 18*  GLUCOSE 133* 126*  BUN 10 24*  CREATININE 0.75 2.28*  CALCIUM 8.9 8.2*   No results for input(s): LABPT,  INR in the last 72 hours. No results for input(s): LABURIN in the last 72 hours. No results found for this or any previous visit.  Imaging: CT Abd/Pelvis 03/02/20 CLINICAL DATA:  Right lower quadrant abdominal pain radiating to the back for few days.  EXAM: CT ABDOMEN AND PELVIS WITH CONTRAST  TECHNIQUE: Multidetector CT imaging of the abdomen and pelvis was performed using the standard protocol following bolus administration of intravenous contrast.  CONTRAST:  100mL OMNIPAQUE IOHEXOL 300 MG/ML  SOLN  COMPARISON:  None.  FINDINGS: Lower chest: No significant pulmonary nodules or acute consolidative airspace disease.  Hepatobiliary: Normal liver size. No liver mass. Normal gallbladder with no  radiopaque cholelithiasis. No biliary ductal dilatation.  Pancreas: Normal, with no mass or duct dilation.  Spleen: Normal size. No mass.  Adrenals/Urinary Tract: Normal adrenals. Asymmetrically delayed left contrast nephrogram with left perinephric fat stranding and ill-defined fluid. Obstructing 4 mm left ureterovesical junction stone with mild left hydroureteronephrosis. No renal masses. No right hydronephrosis. Normal bladder.  Stomach/Bowel: Normal non-distended stomach. Normal caliber small bowel with no small bowel wall thickening. Normal appendix. Normal large bowel with no diverticulosis, large bowel wall thickening or pericolonic fat stranding.  Vascular/Lymphatic: Normal caliber abdominal aorta. Patent portal, splenic, hepatic and renal veins. No pathologically enlarged lymph nodes in the abdomen or pelvis.  Reproductive: Mildly enlarged and mildly heterogeneous uterus. Simple 1.2 cm left adnexal cyst (series 3/image 74), requiring no follow-up. No right adnexal mass.  Other: No pneumoperitoneum, ascites or focal fluid collection.  Musculoskeletal: No aggressive appearing focal osseous lesions. Minimal thoracolumbar spondylosis.  IMPRESSION: Obstructing 4 mm left UVJ stone with mild left hydroureteronephrosis.   A/P: 49-year-old woman with 4 mm left UVJ calculus with mild hydronephrosis with clinical setting of sepsis.  -plan for urgent OR for cystoscopy with ureteral stent placement -admit to hospitalist for sepsis -Risks and benefits of urgent ureteral stent placement discussed with patient including bleeding, infection, pain, stent discomfort, need for staged procedure, inability to place stent, need for future procedures, damage to surrounding structures.  Informed consent obtained. -urine culture from yesterday shows 40,000 CFU's of E. coli susceptibilities pending  Thank you for involving me in this patient's care, I will continue to follow along.  Please page with any further questions or concerns. Gail Creekmore D Cynia Abruzzo  

## 2020-03-03 NOTE — Anesthesia Procedure Notes (Signed)
Procedure Name: LMA Insertion Date/Time: 03/03/2020 4:20 PM Performed by: Yolonda Kida, CRNA Pre-anesthesia Checklist: Patient identified, Emergency Drugs available, Suction available and Patient being monitored Patient Re-evaluated:Patient Re-evaluated prior to induction Oxygen Delivery Method: Circle system utilized Preoxygenation: Pre-oxygenation with 100% oxygen Induction Type: IV induction Ventilation: Mask ventilation without difficulty LMA: LMA inserted LMA Size: 4.0 Number of attempts: 1 Placement Confirmation: positive ETCO2 and breath sounds checked- equal and bilateral Tube secured with: Tape Dental Injury: Teeth and Oropharynx as per pre-operative assessment

## 2020-03-03 NOTE — H&P (Addendum)
History and Physical    Meghan [name] XHB:[id number] DOB: 12-05-1970 DOA: 03/03/2020  PCP: Patient, No Pcp Per (Confirm with patient/family/NH records and if not entered, this has to be entered at [facility] point of entry) Patient coming from: Home  I have personally briefly reviewed patient's old medical records in [facility]  Chief Complaint: Fever and flank pain  HPI: Meghan [name] is a 49 y.o. female with medical history significant of hypertension, morbid obesity, GERD, presented with sudden onset of left-sided flank pain along with nauseous vomiting and fever.  Symptoms started 3 days ago, she has a history of pyelonephritis and diverticulitis,  And she denied any dysuria or diarrhea initially.  Patient came to ED yesterday, when she was afebrile, CT urogram showed obstructing 4 mm left UVJ stone with mild left hydronephrosis.  Urology was contacted yesterday outside, who recommend antibiotics, p.o. hydration and urine strainer and urology follow-up next Monday.  Patient went home, however overnight developed high fever, frequent nauseous vomit and could not put anything down to the night and this morning and came back to ED.  ED Course: Blood pressure low, but responded to 30 cc/kg IV boluses, blood work showed AKI with creatinine 2.2 compared to 0.7 yesterday, lactic acid 4.8.  Urology was reconsulted, who plans to take patient to our for emergency stenting.  Antibiotic started in the ED.  Review of Systems: As per HPI otherwise 10 point review of systems negative.    Past Medical History:  Diagnosis Date  . Colitis     History reviewed. No pertinent surgical history.   has no history on file for tobacco, alcohol, and drug.  No Known Allergies  History reviewed. No pertinent family history.   Prior to Admission medications   Medication Sig Start Date End Date Taking? Authorizing Provider  aspirin EC 81 MG tablet Take 81 mg by mouth daily.    [provider]    bisoprolol (ZEBETA) 10 MG tablet Take 10 mg by mouth daily.    [provider]  cephALEXin (KEFLEX) 500 MG capsule Take 1 capsule (500 mg total) by mouth 4 (four) times daily. 03/02/20   Gailen Shelter, PA  clonazePAM (KLONOPIN) 2 MG tablet Take 2 mg by mouth 2 (two) times daily.    [provider]  losartan-hydrochlorothiazide (HYZAAR) 100-25 MG tablet Take 1 tablet by mouth daily.    [provider]  omeprazole (PRILOSEC) 20 MG capsule Take 20 mg by mouth daily.    [provider]  ondansetron (ZOFRAN ODT) 4 MG disintegrating tablet Take 1 tablet (4 mg total) by mouth every 8 (eight) hours as needed for nausea or vomiting. 03/02/20   Fondaw, Rodrigo Ran, PA  OVER THE COUNTER MEDICATION Take 1 tablet by mouth daily. Enantyum Plus    [provider]  oxyCODONE-acetaminophen (PERCOCET/ROXICET) 5-325 MG tablet Take 2 tablets by mouth every 4 (four) hours as needed for severe pain. 03/02/20   Gailen Shelter, PA  pantoprazole (PROTONIX) 20 MG tablet Take 20 mg by mouth daily.    [provider]  tamsulosin (FLOMAX) 0.4 MG CAPS capsule Take 1 capsule (0.4 mg total) by mouth daily. 03/02/20   Gailen Shelter, PA  vitamin B-12 (CYANOCOBALAMIN) 500 MCG tablet Take 500 mcg by mouth daily.    [provider]    Physical Exam: Vitals:   03/03/20 1319 03/03/20 1330 03/03/20 1345 03/03/20 1400  BP: 104/68 (!) 107/51 (!) 108/53 (!) 100/56  Pulse:  (!) 124 Marland Kitchen)  121 (!) 118  Resp:  (!) 30 (!) 29 (!) 29  Temp:      TempSrc:      SpO2:  95% 95% 95%  Weight:      Height:        Constitutional: NAD, calm, comfortable Vitals:   03/03/20 1319 03/03/20 1330 03/03/20 1345 03/03/20 1400  BP: 104/68 (!) 107/51 (!) 108/53 (!) 100/56  Pulse:  (!) 124 (!) 121 (!) 118  Resp:  (!) 30 (!) 29 (!) 29  Temp:      TempSrc:      SpO2:  95% 95% 95%  Weight:      Height:       Eyes: PERRL, lids and conjunctivae normal ENMT: Mucous membranes are dry. Posterior  pharynx clear of any exudate or lesions.Normal dentition.  Neck: normal, supple, no masses, no thyromegaly Respiratory: clear to auscultation bilaterally, no wheezing, no crackles. Normal respiratory effort. No accessory muscle use.  Cardiovascular: Regular rate and rhythm, no murmurs / rubs / gallops. No extremity edema. 2+ pedal pulses. No carotid bruits.  Abdomen: Left flank tenderness, no masses palpated. No hepatosplenomegaly. Bowel sounds positive.  Musculoskeletal: no clubbing / cyanosis. No joint deformity upper and lower extremities. Good ROM, no contractures. Normal muscle tone.  Skin: no rashes, lesions, ulcers. No induration Neurologic: CN 2-12 grossly intact. Sensation intact, DTR normal. Strength 5/5 in all 4.  Psychiatric: Normal judgment and insight. Alert and oriented x 3. Normal mood.     Labs on Admission: I have personally reviewed following labs and imaging studies  CBC: Recent Labs  Lab 03/02/20 1017 03/03/20 1142  WBC 10.7* 12.2*  NEUTROABS  --  10.5*  HGB 11.2* 11.1*  HCT 35.5* 35.9*  MCV 80.9 81.8  PLT 313 128   Basic Metabolic Panel: Recent Labs  Lab 03/02/20 1017 03/03/20 1142  NA 137 135  K 3.4* 2.9*  CL 100 98  CO2 23 18*  GLUCOSE 133* 126*  BUN 10 24*  CREATININE 0.75 2.28*  CALCIUM 8.9 8.2*   GFR: Estimated Creatinine Clearance: 31.3 mL/min (A) (by C-G formula based on SCr of 2.28 mg/dL (H)). Liver Function Tests: Recent Labs  Lab 03/02/20 1017  AST 26  ALT 16  ALKPHOS 68  BILITOT 0.6  PROT 7.6  ALBUMIN 3.6   Recent Labs  Lab 03/02/20 1017  LIPASE 20   No results for input(s): AMMONIA in the last 168 hours. Coagulation Profile: Recent Labs  Lab 03/03/20 1245  INR 1.6*   Cardiac Enzymes: No results for input(s): CKTOTAL, CKMB, CKMBINDEX, TROPONINI in the last 168 hours. BNP (last 3 results) No results for input(s): PROBNP in the last 8760 hours. HbA1C: No results for input(s): HGBA1C in the last 72 hours. CBG: No  results for input(s): GLUCAP in the last 168 hours. Lipid Profile: No results for input(s): CHOL, HDL, LDLCALC, TRIG, CHOLHDL, LDLDIRECT in the last 72 hours. Thyroid Function Tests: No results for input(s): TSH, T4TOTAL, FREET4, T3FREE, THYROIDAB in the last 72 hours. Anemia Panel: No results for input(s): VITAMINB12, FOLATE, FERRITIN, TIBC, IRON, RETICCTPCT in the last 72 hours. Urine analysis:    Component Value Date/Time   COLORURINE PINK 03/02/2020 1003   APPEARANCEUR TURBID (A) 03/02/2020 1003   LABSPEC 1.015 03/02/2020 1003   PHURINE 8.0 03/02/2020 1003   GLUCOSEU NEGATIVE 03/02/2020 1003   HGBUR LARGE (A) 03/02/2020 1003   BILIRUBINUR NEGATIVE 03/02/2020 1003   KETONESUR NEGATIVE 03/02/2020 1003   PROTEINUR 30 (A) 03/02/2020 1003  NITRITE NEGATIVE 03/02/2020 1003   LEUKOCYTESUR MODERATE (A) 03/02/2020 1003    Radiological Exams on Admission: CT ABDOMEN PELVIS W CONTRAST  Result Date: 03/02/2020 CLINICAL DATA:  Right lower quadrant abdominal pain radiating to the back for few days. EXAM: CT ABDOMEN AND PELVIS WITH CONTRAST TECHNIQUE: Multidetector CT imaging of the abdomen and pelvis was performed using the standard protocol following bolus administration of intravenous contrast. CONTRAST:  OMNIPAQUE IOHEXOL 300 MG/ML  SOLN COMPARISON:  None. FINDINGS: Lower chest: No significant pulmonary nodules or acute consolidative airspace disease. Hepatobiliary: Normal liver size. No liver mass. Normal gallbladder with no radiopaque cholelithiasis. No biliary ductal dilatation. Pancreas: Normal, with no mass or duct dilation. Spleen: Normal size. No mass. Adrenals/Urinary Tract: Normal adrenals. Asymmetrically delayed left contrast nephrogram with left perinephric fat stranding and ill-defined fluid. Obstructing 4 mm left ureterovesical junction stone with mild left hydroureteronephrosis. No renal masses. No right hydronephrosis. Normal bladder. Stomach/Bowel: Normal non-distended  stomach. Normal caliber small bowel with no small bowel wall thickening. Normal appendix. Normal large bowel with no diverticulosis, large bowel wall thickening or pericolonic fat stranding. Vascular/Lymphatic: Normal caliber abdominal aorta. Patent portal, splenic, hepatic and renal veins. No pathologically enlarged lymph nodes in the abdomen or pelvis. Reproductive: Mildly enlarged and mildly heterogeneous uterus. Simple 1.2 cm left adnexal cyst (series 3/image 74), requiring no follow-up. No right adnexal mass. Other: No pneumoperitoneum, ascites or focal fluid collection. Musculoskeletal: No aggressive appearing focal osseous lesions. Minimal thoracolumbar spondylosis. IMPRESSION: Obstructing 4 mm left UVJ stone with mild left hydroureteronephrosis. Electronically Signed   By: Delbert Phenix M.D.   On: 03/02/2020 16:02   DG Chest Port 1 View  Result Date: 03/03/2020 CLINICAL DATA:  Fever EXAM: PORTABLE CHEST 1 VIEW COMPARISON:  None. FINDINGS: Low lung volumes. No consolidation or edema. No pleural effusion. Cardiomediastinal contours are likely within normal limits for technique. IMPRESSION: No acute process in the chest Electronically Signed   By: Guadlupe Spanish M.D.   On: 03/03/2020 12:19    EKG: Independently reviewed.  Sinus tachycardia, flattening of T waves in multiple leads  Assessment/Plan Active Problems:   Streptococcal sepsis, unspecified (HCC)   Sepsis (HCC)  Sepsis secondary to acute pyelonephritis on the left side -Received 30 cc/kg IV boluses, will continue high rate of IV followed 150 mL/h -Urine culture yesterday showed E. coli more than 40,000/hpf, sensitivity pending, continue Rocephin -Trend lactic acid  Left-sided obstructive uropathy secondary to left UVJ stone -Failed outpatient treatment, urology plans to take patient to or for stenting -Continue Flomax if tolerated  AKI with high anion gap metabolic acidosis -Combination from obstructive uropathy plus sepsis -IV  bolus plus high rate IVF  Hypertension -Hold all BP meds for now  Hypokalemia -IV replacement x1    DVT prophylaxis: Heparin subcu Code Status: Full Family Communication: Daughter and son-in-law Disposition Plan: Likely will need 1 to 2 days hospital stay to treat sepsis Consults called: Urology Admission status: PCU   Emeline General MD [facility] Pager [phone]    03/03/2020, 2:50 PM

## 2020-03-03 NOTE — Progress Notes (Signed)
03/03/2020 1900 Received pt from PACU to room 4E-02.  Pt is alert to speech, still drowsy from anesthesia.  Tele monitor applied and CCMD notified.  CHG bath given.  Oriented to room, call light and bed.  Call bell in reach.  Family at bedside. Kathryne Hitch

## 2020-03-03 NOTE — Op Note (Signed)
Preoperative diagnosis:  1. Left ureteral calculus with sepsis  Postoperative diagnosis:  1. Left ureteral calculus with sepsis  Procedure:  1. Cystoscopy 2. Left ureteral stent placement  Surgeon: Kasandra Knudsen, MD  Anesthesia: General  Complications: None  Intraoperative findings: Not completed  EBL: Minimal  Specimens: None  Indication: Meghan [name] is a 49 y.o. patient with 4 mm left UVJ calculus with sepsis. After reviewing the management options for treatment, he elected to proceed with the above surgical procedure(s). We have discussed the potential benefits and risks of the procedure, side effects of the proposed treatment, the likelihood of the patient achieving the goals of the procedure, and any potential problems that might occur during the procedure or recuperation. Informed consent has been obtained.  Description of procedure:  The patient was taken to the operating room and general anesthesia was induced.  The patient was placed in the dorsal lithotomy position, prepped and draped in the usual sterile fashion, and preoperative antibiotics were administered. A preoperative time-out was performed.   Cystourethroscopy was performed.  The patient's urethra was examined and was normal. The bladder was then systematically examined in its entirety. There was no evidence for any bladder tumors, stones, or other mucosal pathology.    Attention then turned to the rightureteral orifice and a ureteral catheter was used to intubate the ureteral orifice.  Attempts at advancing the ureteral catheter were unsuccessful.  Attempts at placing a sensor wire wire through the ureteral catheter and advancing created a submucosal tunnel.  The decision was made to remove the ureteral catheter and attempt cannulation with a zip wire.   An angled zip wire was then was then advanced up the left ureter into the renal pelvis under fluoroscopic guidance.  Purulent efflux from the left ureteral  orifice was seen after the wire was placed.  The ureteral stent was advance over the wire using Seldinger technique.  The stent was positioned appropriately under fluoroscopic and cystoscopic guidance.  The wire was then removed with an adequate stent curl noted in the renal pelvis as well as in the bladder.  The bladder was then emptied and the procedure ended.  The patient appeared to tolerate the procedure well and without complications.  The patient was able to be awakened and transferred to the recovery unit in satisfactory condition.   Follow up: The patient has a Foley catheter in place which should remain for 24 hours for maximal decompression.  The Foley catheter can be removed once patient's clinical status has improved.  She will need definitive stone management in approximately 2 to 3 weeks.     Kasandra Knudsen, M.D.

## 2020-03-03 NOTE — Telephone Encounter (Signed)
Call from ED 03/02/20 ~1745 regarding patient.  49mm UJV stone, obstruction and renal colic.  Afebrile, hemodynamically stable, WBC 10.7, no AKI, UA with neg nitrites, mod LE, few bacteria, 21-50 WBC/hpf.  Ensure pain and nausea control. Follow-up urine culture. Medical expulsive therapy is reasonable given the size and location of the stone without infectious parameters.  We will plan for close clinical follow-up.

## 2020-03-03 NOTE — ED Triage Notes (Signed)
Patient arrives to ED with complaints of worsening LLQ abdominal pain and left flank pain since being discharged from ED yesterday. Patient is now lethargic and has fever of 103. Patient sent back to see EDP from triage.

## 2020-03-03 NOTE — Interval H&P Note (Signed)
History and Physical Interval Note:  03/03/2020 3:47 PM  Meghan [name]  has presented today for surgery, with the diagnosis of URETERAL CALCULUS WITH SEPSIS.  The various methods of treatment have been discussed with the patient and family. After consideration of risks, benefits and other options for treatment, the patient has consented to  Procedure(s): CYSTOSCOPY WITH RETROGRADE PYELOGRAM, URETEROSCOPY AND STENT PLACEMENT (N/A) as a surgical intervention.  The patient's history has been reviewed, patient examined, no change in status, stable for surgery.  I have reviewed the patient's chart and labs.  Questions were answered to the patient's satisfaction.     Mary Hockey D Evans Levee

## 2020-03-03 NOTE — Transfer of Care (Signed)
Immediate Anesthesia Transfer of Care Note  Patient: Amalea Ottey  Procedure(s) Performed: CYSTOSCOPY  STENT PLACEMENT (Left Bladder)  Patient Location: PACU  Anesthesia Type:General  Level of Consciousness: drowsy  Airway & Oxygen Therapy: Patient Spontanous Breathing and Patient connected to nasal cannula oxygen  Post-op Assessment: Report given to RN and Post -op Vital signs reviewed and stable  Post vital signs: Reviewed and stable  Last Vitals:  Vitals Value Taken Time  BP 82/63 03/03/20 1704  Temp    Pulse 99 03/03/20 1707  Resp 26 03/03/20 1707  SpO2 97 % 03/03/20 1707  Vitals shown include unvalidated device data.  Last Pain:  Vitals:   03/03/20 1329  TempSrc:   PainSc: 5       Patients Stated Pain Goal: 0 (03/03/20 1120)  Complications: No apparent anesthesia complications

## 2020-03-03 NOTE — ED Provider Notes (Signed)
[facility] EMERGENCY DEPARTMENT Provider Note   CSN: [id number] Arrival date & time: 03/03/20  1103     History Chief Complaint  Patient presents with  . Abdominal Pain     Meghan [name] is a 49 y.o. female with history of hypertension presents for evaluation of acute onset, progressively worsening nausea and vomiting since yesterday.  She was seen and evaluated in the ED yesterday for left lower quadrant abdominal pain and left flank pain for 2 days.  She states that she did have some nausea and vomiting at the time but reports it was not nearly as severe as it is now.  She states that she cannot keep anything down.  Emesis is nonbloody and nonbilious.  Her work-up in the ED yesterday was significant for an obstructing 4 mm stone at the left UVJ with mild left hydroureteronephrosis.  She reports her abdominal pain has improved.  She denies urinary symptoms, chest pain, shortness of breath, sore throat, nasal congestion, cough.  No known sick contacts, no known Covid exposures.  She has been attempting to take the medications that she was prescribed but has not been able to keep anything down.  Patient is primarily Spanish-speaking and a translator was used throughout the encounter.  She is fully vaccinated against Covid.  The history is provided by the patient. The history is limited by a language barrier. A language interpreter was used.       Past Medical History:  Diagnosis Date  . Colitis     Patient Active Problem List   Diagnosis Date Noted  . Streptococcal sepsis, unspecified ([address]) 03/03/2020  . Sepsis ([place]) 03/03/2020    History reviewed. No pertinent surgical history.   OB History   No obstetric history on file.     History reviewed. No pertinent family history.  Social History   Tobacco Use  . Smoking status: Not on file  Substance Use Topics  . Alcohol use: Not on file  . Drug use: Not on file    Home Medications Prior to Admission  medications   Medication Sig Start Date End Date Taking? Authorizing Provider  aspirin EC 81 MG tablet Take 81 mg by mouth daily.    [provider]  bisoprolol (ZEBETA) 10 MG tablet Take 10 mg by mouth daily.    [provider]  cephALEXin (KEFLEX) 500 MG capsule Take 1 capsule (500 mg total) by mouth 4 (four) times daily. 03/02/20   Tedd Sias, PA  clonazePAM (KLONOPIN) 2 MG tablet Take 2 mg by mouth 2 (two) times daily.    [provider]  losartan-hydrochlorothiazide (HYZAAR) 100-25 MG tablet Take 1 tablet by mouth daily.    [provider]  omeprazole (PRILOSEC) 20 MG capsule Take 20 mg by mouth daily.    [provider]  ondansetron (ZOFRAN ODT) 4 MG disintegrating tablet Take 1 tablet (4 mg total) by mouth every 8 (eight) hours as needed for nausea or vomiting. 03/02/20   Fondaw, Kathleene Hazel, PA  OVER THE COUNTER MEDICATION Take 1 tablet by mouth daily. Enantyum Plus    [provider]  oxyCODONE-acetaminophen (PERCOCET/ROXICET) 5-325 MG tablet Take 2 tablets by mouth every 4 (four) hours as needed for severe pain. 03/02/20   Tedd Sias, PA  pantoprazole (PROTONIX) 20 MG tablet Take 20 mg by mouth daily.    [provider]  tamsulosin (FLOMAX) 0.4 MG CAPS capsule Take 1 capsule (0.4 mg total) by mouth daily. 03/02/20  Solon AugustaFondaw, Wylder S, PA  vitamin B-12 (CYANOCOBALAMIN) 500 MCG tablet Take 500 mcg by mouth daily.    [provider]    Allergies    Patient has no known allergies.  Review of Systems   Review of Systems  Constitutional: Positive for fever.  Respiratory: Negative for shortness of breath.   Cardiovascular: Negative for chest pain.  Gastrointestinal: Positive for abdominal pain, nausea and vomiting.  Genitourinary: Positive for flank pain. Negative for dysuria, hematuria and urgency.  All other systems reviewed and are negative.   Physical Exam Updated Vital Signs BP (!) 100/56   Pulse (!) 118    Temp 100.2 F (37.9 C) (Oral)   Resp (!) 29   Ht 5\' 4"  (1.626 m)   Wt 83.9 kg   LMP 03/02/2020   SpO2 95%   BMI 31.76 kg/m   Physical Exam Vitals and nursing note reviewed.  Constitutional:      Appearance: She is well-developed. She is ill-appearing.  HENT:     Head: Normocephalic and atraumatic.  Eyes:     General:        Right eye: No discharge.        Left eye: No discharge.     Conjunctiva/sclera: Conjunctivae normal.  Neck:     Vascular: No JVD.     Trachea: No tracheal deviation.  Cardiovascular:     Rate and Rhythm: Regular rhythm. Tachycardia present.  Pulmonary:     Effort: Pulmonary effort is normal.     Breath sounds: Normal breath sounds.  Abdominal:     General: Abdomen is flat. Bowel sounds are normal. There is no distension.     Palpations: Abdomen is soft.     Tenderness: There is no abdominal tenderness. There is no right CVA tenderness, left CVA tenderness, guarding or rebound.  Skin:    General: Skin is warm and dry.     Findings: No erythema.  Neurological:     Mental Status: She is alert.  Psychiatric:        Behavior: Behavior normal.     ED Results / Procedures / Treatments   Labs (all labs ordered are listed, but only abnormal results are displayed) Labs Reviewed  BASIC METABOLIC PANEL - Abnormal; Notable for the following components:      Result Value   Potassium 2.9 (*)    CO2 18 (*)    Glucose, Bld 126 (*)    BUN 24 (*)    Creatinine, Ser 2.28 (*)    Calcium 8.2 (*)    GFR calc non Af Amer 24 (*)    GFR calc Af Amer 28 (*)    Anion gap 19 (*)    All other components within normal limits  CBC WITH DIFFERENTIAL/PLATELET - Abnormal; Notable for the following components:   WBC 12.2 (*)    Hemoglobin 11.1 (*)    HCT 35.9 (*)    MCH 25.3 (*)    RDW 17.4 (*)    Neutro Abs 10.5 (*)    nRBC 2 (*)    Abs Immature Granulocytes 0.10 (*)    All other components within normal limits  LACTIC ACID, PLASMA - Abnormal; Notable for the  following components:   Lactic Acid, Venous 4.8 (*)    All other components within normal limits  LACTIC ACID, PLASMA - Abnormal; Notable for the following components:   Lactic Acid, Venous 4.6 (*)    All other components within normal limits  APTT - Abnormal; Notable  for the following components:   aPTT 39 (*)    All other components within normal limits  PROTIME-INR - Abnormal; Notable for the following components:   Prothrombin Time 18.6 (*)    INR 1.6 (*)    All other components within normal limits  SARS CORONAVIRUS 2 BY RT PCR (HOSPITAL ORDER, PERFORMED IN [facility])  CULTURE, BLOOD (ROUTINE X 2)  CULTURE, BLOOD (ROUTINE X 2)  URINE CULTURE  URINALYSIS, ROUTINE W REFLEX MICROSCOPIC  HIV ANTIBODY (ROUTINE TESTING W REFLEX)  I-STAT BETA HCG BLOOD, ED ([facility], [facility], [facility] ONLY)    EKG EKG Interpretation  Date/Time:  Friday March 03 2020 11:51:31 EDT Ventricular Rate:  127 PR Interval:    QRS Duration: 81 QT Interval:  281 QTC Calculation: 409 R Axis:   75 Text Interpretation: Sinus tachycardia Borderline repolarization abnormality No old tracing to compare Confirmed by Meridee Score [phone]) on 03/03/2020 11:57:41 AM   Radiology CT ABDOMEN PELVIS W CONTRAST  Result Date: 03/02/2020 CLINICAL DATA:  Right lower quadrant abdominal pain radiating to the back for few days. EXAM: CT ABDOMEN AND PELVIS WITH CONTRAST TECHNIQUE: Multidetector CT imaging of the abdomen and pelvis was performed using the standard protocol following bolus administration of intravenous contrast. CONTRAST:  OMNIPAQUE IOHEXOL 300 MG/ML  SOLN COMPARISON:  None. FINDINGS: Lower chest: No significant pulmonary nodules or acute consolidative airspace disease. Hepatobiliary: Normal liver size. No liver mass. Normal gallbladder with no radiopaque cholelithiasis. No biliary ductal dilatation. Pancreas: Normal, with no mass or duct dilation. Spleen: Normal size. No mass. Adrenals/Urinary Tract: Normal  adrenals. Asymmetrically delayed left contrast nephrogram with left perinephric fat stranding and ill-defined fluid. Obstructing 4 mm left ureterovesical junction stone with mild left hydroureteronephrosis. No renal masses. No right hydronephrosis. Normal bladder. Stomach/Bowel: Normal non-distended stomach. Normal caliber small bowel with no small bowel wall thickening. Normal appendix. Normal large bowel with no diverticulosis, large bowel wall thickening or pericolonic fat stranding. Vascular/Lymphatic: Normal caliber abdominal aorta. Patent portal, splenic, hepatic and renal veins. No pathologically enlarged lymph nodes in the abdomen or pelvis. Reproductive: Mildly enlarged and mildly heterogeneous uterus. Simple 1.2 cm left adnexal cyst (series 3/image 74), requiring no follow-up. No right adnexal mass. Other: No pneumoperitoneum, ascites or focal fluid collection. Musculoskeletal: No aggressive appearing focal osseous lesions. Minimal thoracolumbar spondylosis. IMPRESSION: Obstructing 4 mm left UVJ stone with mild left hydroureteronephrosis. Electronically Signed   By: Delbert Phenix M.D.   On: 03/02/2020 16:02   DG Chest Port 1 View  Result Date: 03/03/2020 CLINICAL DATA:  Fever EXAM: PORTABLE CHEST 1 VIEW COMPARISON:  None. FINDINGS: Low lung volumes. No consolidation or edema. No pleural effusion. Cardiomediastinal contours are likely within normal limits for technique. IMPRESSION: No acute process in the chest Electronically Signed   By: Guadlupe Spanish M.D.   On: 03/03/2020 12:19    Procedures .Critical Care Performed by: Jeanie Sewer, PA-C Authorized by: Jeanie Sewer, PA-C   Critical care provider statement:    Critical care time (minutes):  45   Critical care was necessary to treat or prevent imminent or life-threatening deterioration of the following conditions:  Shock, sepsis and renal failure   Critical care was time spent personally by me on the following activities:  Discussions with  consultants, evaluation of patient's response to treatment, examination of patient, ordering and performing treatments and interventions, ordering and review of laboratory studies, ordering and review of radiographic studies, pulse oximetry, re-evaluation of patient's condition, obtaining history  from patient or surrogate and review of old charts   (including critical care time)  Medications Ordered in ED Medications  cefTRIAXone (ROCEPHIN) 1 g in sodium chloride 0.9 % 100 mL IVPB (0 g Intravenous Stopped 03/03/20 1242)  potassium chloride 10 mEq in 100 mL IVPB (10 mEq Intravenous New Bag/Given 03/03/20 1424)  heparin injection 5,000 Units (has no administration in time range)  acetaminophen (TYLENOL) tablet 650 mg (has no administration in time range)    Or  acetaminophen (TYLENOL) suppository 650 mg (has no administration in time range)  HYDROmorphone (DILAUDID) injection 0.5-1 mg (has no administration in time range)  ondansetron (ZOFRAN) tablet 4 mg (has no administration in time range)    Or  ondansetron (ZOFRAN) injection 4 mg (has no administration in time range)  0.9 %  sodium chloride infusion (has no administration in time range)  lactated ringers bolus 1,000 mL (0 mLs Intravenous Stopped 03/03/20 1401)    And  lactated ringers bolus 1,000 mL (0 mLs Intravenous Stopped 03/03/20 1401)    And  lactated ringers bolus 1,000 mL (1,000 mLs Intravenous New Bag/Given 03/03/20 1327)  acetaminophen (TYLENOL) suppository 650 mg (650 mg Rectal Given 03/03/20 1200)  ondansetron (ZOFRAN) injection 4 mg (4 mg Intravenous Given 03/03/20 1200)  promethazine (PHENERGAN) injection 12.5 mg (12.5 mg Intravenous Given 03/03/20 1322)    ED Course  I have reviewed the triage vital signs and the nursing notes.  Pertinent labs & imaging results that were available during my care of the patient were reviewed by me and considered in my medical decision making (see chart for details).  Clinical Course as of Mar 03 1449   Fri Mar 03, 2020  5851 49 year old female who is here yesterday with renal colic obstructing stone back with vomiting fever tachycardia.  Code sepsis initiated.  IV antibiotics and fluids initiated.  Will likely need stenting or percutaneous nephrostomy.  Will need admission.   [MB]    Clinical Course User Index [MB] Terrilee Files, MD   MDM Rules/Calculators/A&P                       Patient presenting for evaluation of progressively worsening symptoms.  She was seen yesterday and was diagnosed with an obstructive left UVJ stone with UA concerning for UTI and was discharged with symptomatic management and a course of Keflex.  Since then has developed fever and persistent vomiting and has not been able to keep down any of her medications.  In the ED she is initially febrile to 103 F, tachycardic and hypotensive.  She appears ill.  Code sepsis was initiated and the patient was started on IV Rocephin for presumed urosepsis.  She was given 30 cc/kg bolus of fluids with modest improvement in her blood pressure.  Lab work reviewed and interpreted by myself shows worsening leukocytosis of 12.2, anemia with hemoglobin of 11.1 stable compared to yesterday.  Her kidney function is worse today compared to yesterday she appears to have an AKI with creatinine of 2.28, BUN 24 today.  Suspect this is likely in the setting of dehydration, persistent vomiting.  She is hypokalemic with a potassium of 2.9 but QT interval is within normal limits.  Will replenish through the IV.  Elevated anion gap likely in the setting of starvation ketosis vs lactic acidosis.  Her lactic acid level is elevated, mild improvement on reevaluation. I suspect she may have an infected kidney stone.   CONSULT: Spoke with Dr. Arita Miss  with urology, plan for stent placement in the OR today.  She request hospitalist admission for management of her sepsis.  Spoke with Dr. Chipper Herb with [facility] hospitalist service who agrees to assume care of  patient and bring her to the hospital for further evaluation and management.   Final Clinical Impression(s) / ED Diagnoses Final diagnoses:  Sepsis with acute renal failure and septic shock, due to unspecified organism, unspecified acute renal failure type (HCC)  Hypokalemia    Rx / DC Orders ED Discharge Orders    None       Bennye Alm 03/03/20 1507    Terrilee Files, MD 03/03/20 1944

## 2020-03-03 NOTE — Progress Notes (Signed)
This is to document that patient encounter took place with video interpretor and informed consent was obtained using interpreter.

## 2020-03-03 NOTE — Anesthesia Preprocedure Evaluation (Signed)
Anesthesia Evaluation  Patient identified by MRN, date of birth, ID band Patient awake    Reviewed: Allergy & Precautions, NPO status , Patient's Chart, lab work & pertinent test results  Airway Mallampati: II  TM Distance: >3 FB Neck ROM: Full    Dental no notable dental hx.    Pulmonary neg pulmonary ROS,    Pulmonary exam normal breath sounds clear to auscultation       Cardiovascular negative cardio ROS Normal cardiovascular exam Rhythm:Regular Rate:Normal     Neuro/Psych negative neurological ROS  negative psych ROS   GI/Hepatic negative GI ROS, Neg liver ROS,   Endo/Other  negative endocrine ROS  Renal/GU negative Renal ROS  negative genitourinary   Musculoskeletal negative musculoskeletal ROS (+)   Abdominal (+) + obese,   Peds negative pediatric ROS (+)  Hematology negative hematology ROS (+)   Anesthesia Other Findings   Reproductive/Obstetrics negative OB ROS                             Anesthesia Physical Anesthesia Plan  ASA: II  Anesthesia Plan: General   Post-op Pain Management:    Induction: Intravenous  PONV Risk Score and Plan: 3 and Ondansetron, Dexamethasone, Midazolam and Treatment may vary due to age or medical condition  Airway Management Planned: LMA  Additional Equipment:   Intra-op Plan:   Post-operative Plan: Extubation in OR  Informed Consent: I have reviewed the patients History and Physical, chart, labs and discussed the procedure including the risks, benefits and alternatives for the proposed anesthesia with the patient or authorized representative who has indicated his/her understanding and acceptance.     Dental advisory given  Plan Discussed with: CRNA  Anesthesia Plan Comments:         Anesthesia Quick Evaluation  

## 2020-03-04 ENCOUNTER — Inpatient Hospital Stay (HOSPITAL_COMMUNITY): Payer: Self-pay

## 2020-03-04 LAB — CBC
HCT: 28.8 % — ABNORMAL LOW (ref 36.0–46.0)
Hemoglobin: 9.2 g/dL — ABNORMAL LOW (ref 12.0–15.0)
MCH: 25.9 pg — ABNORMAL LOW (ref 26.0–34.0)
MCHC: 31.9 g/dL (ref 30.0–36.0)
MCV: 81.1 fL (ref 80.0–100.0)
Platelets: 109 10*3/uL — ABNORMAL LOW (ref 150–400)
RBC: 3.55 MIL/uL — ABNORMAL LOW (ref 3.87–5.11)
RDW: 17.6 % — ABNORMAL HIGH (ref 11.5–15.5)
WBC: 18 10*3/uL — ABNORMAL HIGH (ref 4.0–10.5)
nRBC: 0 % (ref 0.0–0.2)

## 2020-03-04 LAB — BLOOD CULTURE ID PANEL (REFLEXED)

## 2020-03-04 LAB — URINE CULTURE: Culture: 40000 — AB

## 2020-03-04 LAB — BASIC METABOLIC PANEL
Anion gap: 10 (ref 5–15)
BUN: 16 mg/dL (ref 6–20)
CO2: 24 mmol/L (ref 22–32)
Calcium: 7.7 mg/dL — ABNORMAL LOW (ref 8.9–10.3)
Chloride: 109 mmol/L (ref 98–111)
Creatinine, Ser: 0.91 mg/dL (ref 0.44–1.00)
GFR calc Af Amer: >60 mL/min (ref >60)
GFR calc non Af Amer: >60 mL/min (ref >60)
Glucose, Bld: 171 mg/dL — ABNORMAL HIGH (ref 70–99)
Potassium: 3.5 mmol/L (ref 3.5–5.1)
Sodium: 143 mmol/L (ref 135–145)

## 2020-03-04 LAB — LACTIC ACID, PLASMA
Lactic Acid, Venous: 2.9 mmol/L (ref 0.5–1.9)
Lactic Acid, Venous: 3.4 mmol/L (ref 0.5–1.9)

## 2020-03-04 LAB — HIV ANTIBODY (ROUTINE TESTING W REFLEX): HIV Screen 4th Generation wRfx: NONREACTIVE

## 2020-03-04 MED ORDER — CHLORHEXIDINE GLUCONATE CLOTH 2 % EX PADS
6.0000 | MEDICATED_PAD | Freq: Every day | CUTANEOUS | Status: DC
  Administered 2020-03-04 – 2020-03-05 (×2): 6 via TOPICAL

## 2020-03-04 MED ORDER — BISOPROLOL FUMARATE 5 MG PO TABS
5.0000 mg | ORAL_TABLET | Freq: Every day | ORAL | Status: DC
  Administered 2020-03-04: 5 mg via ORAL
  Filled 2020-03-04: qty 1

## 2020-03-04 MED ORDER — SODIUM CHLORIDE 0.9 % IV SOLN
1.0000 g | Freq: Once | INTRAVENOUS | Status: AC
  Administered 2020-03-04: 1 g via INTRAVENOUS
  Filled 2020-03-04: qty 1

## 2020-03-04 MED ORDER — SODIUM CHLORIDE 0.9 % IV SOLN
2.0000 g | INTRAVENOUS | Status: DC
  Administered 2020-03-05 – 2020-03-07 (×3): 2 g via INTRAVENOUS
  Filled 2020-03-04 (×3): qty 2

## 2020-03-04 MED ORDER — SODIUM CHLORIDE 0.9 % IV BOLUS: 500.0000 mL | Freq: Once | INTRAVENOUS | Status: DC

## 2020-03-04 MED ORDER — SODIUM CHLORIDE 0.9 % IV SOLN: 2.0000 g | INTRAVENOUS | Status: DC

## 2020-03-04 MED ORDER — CLONAZEPAM 1 MG PO TABS
1.0000 mg | ORAL_TABLET | Freq: Two times a day (BID) | ORAL | Status: DC | PRN
  Administered 2020-03-05 – 2020-03-06 (×3): 1 mg via ORAL
  Filled 2020-03-04 (×3): qty 1

## 2020-03-04 MED ORDER — METOCLOPRAMIDE HCL 5 MG/ML IJ SOLN
10.0000 mg | Freq: Once | INTRAMUSCULAR | Status: AC
  Administered 2020-03-04: 10 mg via INTRAVENOUS
  Filled 2020-03-04: qty 2

## 2020-03-04 NOTE — Progress Notes (Signed)
1 Day Post-Op Subjective: Patient with bladder cramping this AM.    Objective: Vital signs in last 24 hours: Temp:  [97.9 F (36.6 C)-99.6 F (37.6 C)] 97.9 F (36.6 C) (06/05 1523) Pulse Rate:  [89-102] 96 (06/05 1523) Resp:  [14-29] 29 (06/05 1523) BP: (107-141)/(60-80) 141/80 (06/05 1523) SpO2:  [87 %-98 %] 97 % (06/05 1523)  Intake/Output from previous day: 06/04 0701 - 06/05 0700 In: 5180.8 [I.V.:1491.5; IV Piggyback:3339.2] Out: 1750 [Urine:1750] Intake/Output this shift: Total I/O In: 1960 [P.O.:360; I.V.:1400; IV Piggyback:200] Out: 550 [Urine:550]  Physical Exam:  General: Alert and oriented CV: RRR Lungs: Clear Abdomen: Soft, ND Foley draining pink tinged yellow urine Ext: NT, No erythema  Lab Results: Recent Labs    03/02/20 1017 03/03/20 1142 03/04/20 0350  HGB 11.2* 11.1* 9.2*  HCT 35.5* 35.9* 28.8*   BMET Recent Labs    03/03/20 1142 03/04/20 0350  NA 135 143  K 2.9* 3.5  CL 98 109  CO2 18* 24  GLUCOSE 126* 171*  BUN 24* 16  CREATININE 2.28* 0.91  CALCIUM 8.2* 7.7*     Studies/Results: DG CHEST PORT 1 VIEW  Result Date: 03/04/2020 CLINICAL DATA:  Shortness of breath, abdominal pain EXAM: PORTABLE CHEST 1 VIEW COMPARISON:  03/03/2020 FINDINGS: Mild cardiomegaly. New diffuse bilateral interstitial opacity. The visualized skeletal structures are unremarkable. IMPRESSION: Mild cardiomegaly with new diffuse bilateral interstitial pulmonary opacity, consistent with edema. No focal airspace opacity. Electronically Signed   By: Lauralyn Primes M.D.   On: 03/04/2020 11:35   DG Chest Port 1 View  Result Date: 03/03/2020 CLINICAL DATA:  Fever EXAM: PORTABLE CHEST 1 VIEW COMPARISON:  None. FINDINGS: Low lung volumes. No consolidation or edema. No pleural effusion. Cardiomediastinal contours are likely within normal limits for technique. IMPRESSION: No acute process in the chest Electronically Signed   By: Guadlupe Spanish M.D.   On: 03/03/2020 12:19     Assessment/Plan: 49 yo woman with 4 mm left UVJ calculus POD1 s/p cystoscopy with left ureteral stent placement.  -blood and urine cx pending -ureteral stent will remain in place until definitive stone removal in approx 2-3 weeks -will arrange outpatient urology follow up   LOS: 1 day   Aarron Wierzbicki D Tvisha Schwoerer 03/04/2020, 6:08 PM

## 2020-03-04 NOTE — Progress Notes (Signed)
Mobility Specialist - Progress Note   03/04/20 1341  Mobility  Activity Transferred:  Chair to bed  Level of Assistance Minimal assist, patient does 75% or more  Assistive Device Front wheel walker  Mobility Response Tolerated fair  Mobility performed by Mobility specialist  $Mobility charge 1 Mobility   Pre-mobility: 91 HR, 88% SpO2 Post-mobility: 97 HR, 87% SpO2  Pt was not able to sit up 3x due to nausea, she then sat up a fourth time to vomit. After transferring her to her bed with a RW and min assist to stand her up, she then vomited a second time when sitting in her bed.   Meghan [name] Mobility Specialist

## 2020-03-04 NOTE — Progress Notes (Signed)
PROGRESS NOTE    Meghan [name]  YTK:[id number] DOB: 1971-01-23 DOA: 03/03/2020 PCP: Patient, No Pcp Per   Brief Narrative: 49 year old with past medical history significant for hypertension, morbid obesity, GERD she said with sudden onset of left-sided flank pain accompanied with nausea, vomiting and fever.  Symptoms started 3 days prior to admission.  She was evaluated in the ED the day prior to admission CT showed obstructing 4 mm left UVJ stone on with mild left hydronephrosis.  Urology was contacted curbside recommended oral antibiotics and follow-up as an outpatient.  Patient went home but developed high fever, nausea vomiting unable to keep antibiotics presented to the hospital for further treatment.  Patient admitted with sepsis secondary to obstructing 4 mm left UVJ stone pyelonephritis.  She underwent cystoscopy and left ureteral stent placement on 03/03/2020   Assessment & Plan:   Active Problems:   Streptococcal sepsis, unspecified ([place])   Sepsis ([place])   1-Sepsis secondary to pyelonephritis secondary to left side obstructive uropathy left UV J stone; Patient received 30 cc/kg IV boluses. Urine culture from 6/3 growing E. Coli Blood cultures growing Proteus Continue with  with IV ceftriaxone Still with elevated lactic acid and leukocytosis If BP drop might need bolus and IV pressors due to pulmonary edema will hold IV.   2-Left-sided obstructive uropathy secondary to left UVJ stone: Underwent cystoscopy and left ureteral stent placement on 6//2021 -Continue with supportive care. -Urology recommended follow-up in 2 to 3 weeks to have stent removal  AKI with anion gap metabolic acidosis Secondary to obstructive uropathy and sepsis Improved with IV fluids  Hypertension: Hold losartan/hydrochlorothiazide due to AKI and sepsis Presumed diastolic  Acute hypoxic respiratory failure; Chest x-ray with bilateral pulmonary edema Will hold IV fluids  Estimated body mass index is  31.76 kg/m as calculated from the following:   Height as of this encounter: 5\' 4"  (1.626 m).   Weight as of this encounter: 83.9 kg.   DVT prophylaxis: Lovenox Code Status: Full code Family Communication: care discussed with daughter  Disposition Plan:  Status is: Inpatient  Remains inpatient appropriate because:Hemodynamically unstable   Dispo: The patient is from: Home              Anticipated d/c is to: Home              Anticipated d/c date is: 3 days              Patient currently is not medically stable to d/c.        Consultants:   Urology   Procedures:     Antimicrobials:  Ceftriaxone  Subjective: Still vomiting and with nausea.   Objective: Vitals:   03/04/20 0432 03/04/20 0530 03/04/20 0802 03/04/20 1100  BP: 140/74 120/78 124/75 131/77  Pulse: (!) 102  93 100  Resp: (!) 23 14 (!) 22 (!) 28  Temp: 99.6 F (37.6 C) 99.5 F (37.5 C) 99 F (37.2 C) 98.2 F (36.8 C)  TempSrc: Oral Oral Oral Oral  SpO2: 96% 96% 97% 94%  Weight:      Height:        Intake/Output Summary (Last 24 hours) at 03/04/2020 1334 Last data filed at 03/04/2020 0804 Gross per 24 hour  Intake 5180.77 ml  Output 2050 ml  Net 3130.77 ml   Filed Weights   03/03/20 1110  Weight: 83.9 kg    Examination:  General exam: Appears calm and comfortable , sick appearing.  Respiratory system: tachypnea, Bilateral crackles.  Cardiovascular  system: S1 & S2 heard, RRR. No JVD, murmurs, rubs, gallops or clicks. No pedal edema. Gastrointestinal system: Abdomen is nondistended, soft and nontender. No organomegaly or masses felt. Normal bowel sounds heard. Central nervous system: Alert and oriented. No focal neurological deficits. Extremities: Symmetric 5 x 5 power.   Data Reviewed: I have personally reviewed following labs and imaging studies  CBC: Recent Labs  Lab 03/02/20 1017 03/03/20 1142 03/04/20 0350  WBC 10.7* 12.2* 18.0*  NEUTROABS  --  10.5*  --   HGB 11.2* 11.1*  9.2*  HCT 35.5* 35.9* 28.8*  MCV 80.9 81.8 81.1  PLT 313 168 109*   Basic Metabolic Panel: Recent Labs  Lab 03/02/20 1017 03/03/20 1142 03/04/20 0350  NA 137 135 143  K 3.4* 2.9* 3.5  CL 100 98 109  CO2 23 18* 24  GLUCOSE 133* 126* 171*  BUN 10 24* 16  CREATININE 0.75 2.28* 0.91  CALCIUM 8.9 8.2* 7.7*   GFR: Estimated Creatinine Clearance: 78.4 mL/min (by C-G formula based on SCr of 0.91 mg/dL). Liver Function Tests: Recent Labs  Lab 03/02/20 1017  AST 26  ALT 16  ALKPHOS 68  BILITOT 0.6  PROT 7.6  ALBUMIN 3.6   Recent Labs  Lab 03/02/20 1017  LIPASE 20   No results for input(s): AMMONIA in the last 168 hours. Coagulation Profile: Recent Labs  Lab 03/03/20 1245  INR 1.6*   Cardiac Enzymes: No results for input(s): CKTOTAL, CKMB, CKMBINDEX, TROPONINI in the last 168 hours. BNP (last 3 results) No results for input(s): PROBNP in the last 8760 hours. HbA1C: No results for input(s): HGBA1C in the last 72 hours. CBG: No results for input(s): GLUCAP in the last 168 hours. Lipid Profile: No results for input(s): CHOL, HDL, LDLCALC, TRIG, CHOLHDL, LDLDIRECT in the last 72 hours. Thyroid Function Tests: No results for input(s): TSH, T4TOTAL, FREET4, T3FREE, THYROIDAB in the last 72 hours. Anemia Panel: No results for input(s): VITAMINB12, FOLATE, FERRITIN, TIBC, IRON, RETICCTPCT in the last 72 hours. Sepsis Labs: Recent Labs  Lab 03/03/20 1142 03/03/20 1245  LATICACIDVEN 4.8* 4.6*    Recent Results (from the past 240 hour(s))  Urine culture     Status: Abnormal (Preliminary result)   Collection Time: 03/02/20 10:03 AM   Specimen: Urine, Catheterized  Result Value Ref Range Status   Specimen Description URINE, CATHETERIZED  Final   Special Requests NONE  Final   Culture (A)  Final    40,000 COLONIES/mL ESCHERICHIA COLI CULTURE REINCUBATED FOR BETTER GROWTH Performed at [facility]  Lab, [address] [place], [place] [id number]    Report Status  PENDING  Incomplete   Organism ID, Bacteria ESCHERICHIA COLI (A)  Final      Susceptibility   Escherichia coli - MIC*    AMPICILLIN >=32 RESISTANT Resistant     CEFAZOLIN <=4 SENSITIVE Sensitive     CEFTRIAXONE <=1 SENSITIVE Sensitive     CIPROFLOXACIN <=0.25 SENSITIVE Sensitive     GENTAMICIN <=1 SENSITIVE Sensitive     IMIPENEM <=0.25 SENSITIVE Sensitive     NITROFURANTOIN 64 INTERMEDIATE Intermediate     TRIMETH/SULFA >=320 RESISTANT Resistant     AMPICILLIN/SULBACTAM 8 SENSITIVE Sensitive     PIP/TAZO <=4 SENSITIVE Sensitive     * 40,000 COLONIES/mL ESCHERICHIA COLI  Culture, blood (routine x 2)     Status: Abnormal (Preliminary result)   Collection Time: 03/03/20 11:42 AM   Specimen: BLOOD LEFT FOREARM  Result Value Ref Range Status   Specimen  Description BLOOD LEFT FOREARM  Final   Special Requests   Final    BOTTLES DRAWN AEROBIC AND ANAEROBIC Blood Culture adequate volume   Culture  Setup Time   Final    IN BOTH AEROBIC AND ANAEROBIC BOTTLES GRAM NEGATIVE RODS CRITICAL RESULT CALLED TO, READ BACK BY AND VERIFIED WITH: Lytle Butte [facility] 03/04/20 0103 JDW Performed at [facility], [address] [place], [place] [address]    Culture PROTEUS MIRABILIS (A)  Final   Report Status PENDING  Incomplete  Blood Culture ID Panel (Reflexed)     Status: Abnormal   Collection Time: 03/03/20 11:42 AM  Result Value Ref Range Status   Enterococcus species NOT DETECTED NOT DETECTED Final   Listeria monocytogenes NOT DETECTED NOT DETECTED Final   Staphylococcus species NOT DETECTED NOT DETECTED Final   Staphylococcus aureus (BCID) NOT DETECTED NOT DETECTED Final   Streptococcus species NOT DETECTED NOT DETECTED Final   Streptococcus agalactiae NOT DETECTED NOT DETECTED Final   Streptococcus pneumoniae NOT DETECTED NOT DETECTED Final   Streptococcus pyogenes NOT DETECTED NOT DETECTED Final   Acinetobacter baumannii NOT DETECTED NOT DETECTED Final   Enterobacteriaceae species DETECTED  (A) NOT DETECTED Final    Comment: Enterobacteriaceae represent a large family of gram-negative bacteria, not a single organism. CRITICAL RESULT CALLED TO, READ BACK BY AND VERIFIED WITH: Lytle Butte PHARMD 03/04/20 0103 JDW    Enterobacter cloacae complex NOT DETECTED NOT DETECTED Final   Escherichia coli NOT DETECTED NOT DETECTED Final   Klebsiella oxytoca NOT DETECTED NOT DETECTED Final   Klebsiella pneumoniae NOT DETECTED NOT DETECTED Final   Proteus species DETECTED (A) NOT DETECTED Final    Comment: CRITICAL RESULT CALLED TO, READ BACK BY AND VERIFIED WITH: M MACCIA PHARMD 03/04/20 0103 JDW    Serratia marcescens NOT DETECTED NOT DETECTED Final   Carbapenem resistance NOT DETECTED NOT DETECTED Final   Haemophilus influenzae NOT DETECTED NOT DETECTED Final   Neisseria meningitidis NOT DETECTED NOT DETECTED Final   Pseudomonas aeruginosa NOT DETECTED NOT DETECTED Final   Candida albicans NOT DETECTED NOT DETECTED Final   Candida glabrata NOT DETECTED NOT DETECTED Final   Candida krusei NOT DETECTED NOT DETECTED Final   Candida parapsilosis NOT DETECTED NOT DETECTED Final   Candida tropicalis NOT DETECTED NOT DETECTED Final    Comment: Performed at [facility], [address], [place], [place] [address]  SARS Coronavirus 2 by RT PCR (hospital order, performed in [facility] hospital lab) Nasopharyngeal Nasopharyngeal Swab     Status: None   Collection Time: 03/03/20 12:06 PM   Specimen: Nasopharyngeal Swab  Result Value Ref Range Status   SARS Coronavirus 2 NEGATIVE NEGATIVE Final    Comment: (NOTE) SARS-CoV-2 target nucleic acids are NOT DETECTED. The SARS-CoV-2 RNA is generally detectable in upper and lower respiratory specimens during the acute phase of infection. The lowest concentration of SARS-CoV-2 viral copies this assay can detect is 250 copies / mL. A negative result does not preclude SARS-CoV-2 infection and should not be used as the sole basis for treatment or  other patient management decisions.  A negative result may occur with improper specimen collection / handling, submission of specimen other than nasopharyngeal swab, presence of viral mutation(s) within the areas targeted by this assay, and inadequate number of viral copies (<250 copies / mL). A negative result must be combined with clinical observations, patient history, and epidemiological information. Fact Sheet for Patients:   BoilerBrush.com.cy Fact Sheet for  Healthcare Providers: https://pope.com/ This test is not yet approved or cleared  by the [place] and has been authorized for detection and/or diagnosis of SARS-CoV-2 by FDA under an Emergency Use Authorization (EUA).  This EUA will remain in effect (meaning this test can be used) for the duration of the COVID-19 declaration under Section 564(b)(1) of the Act, 21 U.S.C. section 360bbb-3(b)(1), unless the authorization is terminated or revoked sooner. Performed at [facility], [address], [place], [place] [address]   Culture, blood (routine x 2)     Status: None (Preliminary result)   Collection Time: 03/03/20 12:45 PM   Specimen: BLOOD RIGHT WRIST  Result Value Ref Range Status   Specimen Description BLOOD RIGHT WRIST  Final   Special Requests   Final    BOTTLES DRAWN AEROBIC AND ANAEROBIC Blood Culture results may not be optimal due to an inadequate volume of blood received in culture bottles   Culture   Final    NO GROWTH < 24 HOURS Performed at [facility], [address], [place], [place] [address]    Report Status PENDING  Incomplete         Radiology Studies: CT ABDOMEN PELVIS W CONTRAST  Result Date: 03/02/2020 CLINICAL DATA:  Right lower quadrant abdominal pain radiating to the back for few days. EXAM: CT ABDOMEN AND PELVIS WITH CONTRAST TECHNIQUE: Multidetector CT imaging of the abdomen and pelvis was performed using the standard  protocol following bolus administration of intravenous contrast. CONTRAST:  OMNIPAQUE IOHEXOL 300 MG/ML  SOLN COMPARISON:  None. FINDINGS: Lower chest: No significant pulmonary nodules or acute consolidative airspace disease. Hepatobiliary: Normal liver size. No liver mass. Normal gallbladder with no radiopaque cholelithiasis. No biliary ductal dilatation. Pancreas: Normal, with no mass or duct dilation. Spleen: Normal size. No mass. Adrenals/Urinary Tract: Normal adrenals. Asymmetrically delayed left contrast nephrogram with left perinephric fat stranding and ill-defined fluid. Obstructing 4 mm left ureterovesical junction stone with mild left hydroureteronephrosis. No renal masses. No right hydronephrosis. Normal bladder. Stomach/Bowel: Normal non-distended stomach. Normal caliber small bowel with no small bowel wall thickening. Normal appendix. Normal large bowel with no diverticulosis, large bowel wall thickening or pericolonic fat stranding. Vascular/Lymphatic: Normal caliber abdominal aorta. Patent portal, splenic, hepatic and renal veins. No pathologically enlarged lymph nodes in the abdomen or pelvis. Reproductive: Mildly enlarged and mildly heterogeneous uterus. Simple 1.2 cm left adnexal cyst (series 3/image 74), requiring no follow-up. No right adnexal mass. Other: No pneumoperitoneum, ascites or focal fluid collection. Musculoskeletal: No aggressive appearing focal osseous lesions. Minimal thoracolumbar spondylosis. IMPRESSION: Obstructing 4 mm left UVJ stone with mild left hydroureteronephrosis. Electronically Signed   By: Delbert Phenix M.D.   On: 03/02/2020 16:02   DG CHEST PORT 1 VIEW  Result Date: 03/04/2020 CLINICAL DATA:  Shortness of breath, abdominal pain EXAM: PORTABLE CHEST 1 VIEW COMPARISON:  03/03/2020 FINDINGS: Mild cardiomegaly. New diffuse bilateral interstitial opacity. The visualized skeletal structures are unremarkable. IMPRESSION: Mild cardiomegaly with new diffuse bilateral  interstitial pulmonary opacity, consistent with edema. No focal airspace opacity. Electronically Signed   By: Lauralyn Primes M.D.   On: 03/04/2020 11:35   DG Chest Port 1 View  Result Date: 03/03/2020 CLINICAL DATA:  Fever EXAM: PORTABLE CHEST 1 VIEW COMPARISON:  None. FINDINGS: Low lung volumes. No consolidation or edema. No pleural effusion. Cardiomediastinal contours are likely within normal limits for technique. IMPRESSION: No acute process in the chest Electronically Signed   By: Jackquline Berlin.D.  On: 03/03/2020 12:19        Scheduled Meds:  bisoprolol  5 mg Oral Daily   Chlorhexidine Gluconate Cloth  6 each Topical Daily   heparin  5,000 Units Subcutaneous Q8H   Continuous Infusions:  sodium chloride 100 mL/hr at 03/04/20 1132   cefTRIAXone (ROCEPHIN)  IV     [START ON 03/05/2020] cefTRIAXone (ROCEPHIN)  IV     lactated ringers Stopped (03/03/20 1707)   sodium chloride       LOS: 1 day    Time spent: 35 minutes.     Alba Cory, MD [facility]   If 7PM-7AM, please contact night-coverage [email or web address]  03/04/2020, 1:34 PM

## 2020-03-04 NOTE — Plan of Care (Signed)

## 2020-03-04 NOTE — Progress Notes (Signed)
PHARMACY - PHYSICIAN COMMUNICATION CRITICAL VALUE ALERT - BLOOD CULTURE IDENTIFICATION (BCID)  Meghan [name] is an 49 y.o. female with pyelo     Results for orders placed or performed during the hospital encounter of 03/03/20  Blood Culture ID Panel (Reflexed) (Collected: 03/03/2020 11:42 AM)  Result Value Ref Range   Enterococcus species NOT DETECTED NOT DETECTED   Listeria monocytogenes NOT DETECTED NOT DETECTED   Staphylococcus species NOT DETECTED NOT DETECTED   Staphylococcus aureus (BCID) NOT DETECTED NOT DETECTED   Streptococcus species NOT DETECTED NOT DETECTED   Streptococcus agalactiae NOT DETECTED NOT DETECTED   Streptococcus pneumoniae NOT DETECTED NOT DETECTED   Streptococcus pyogenes NOT DETECTED NOT DETECTED   Acinetobacter baumannii NOT DETECTED NOT DETECTED   Enterobacteriaceae species DETECTED (A) NOT DETECTED   Enterobacter cloacae complex NOT DETECTED NOT DETECTED   Escherichia coli NOT DETECTED NOT DETECTED   Klebsiella oxytoca NOT DETECTED NOT DETECTED   Klebsiella pneumoniae NOT DETECTED NOT DETECTED   Proteus species DETECTED (A) NOT DETECTED   Serratia marcescens NOT DETECTED NOT DETECTED   Carbapenem resistance NOT DETECTED NOT DETECTED   Haemophilus influenzae NOT DETECTED NOT DETECTED   Neisseria meningitidis NOT DETECTED NOT DETECTED   Pseudomonas aeruginosa NOT DETECTED NOT DETECTED   Candida albicans NOT DETECTED NOT DETECTED   Candida glabrata NOT DETECTED NOT DETECTED   Candida krusei NOT DETECTED NOT DETECTED   Candida parapsilosis NOT DETECTED NOT DETECTED   Candida tropicalis NOT DETECTED NOT DETECTED   Name of physician (or Provider) ContactedBruna Potter  Plan: Continue current ceftriaxone   Elmer Sow, PharmD, BCPS, BCCCP Clinical Pharmacist [phone]  Please check AMION for all [facility] numbers  03/04/2020 1:17 AM

## 2020-03-05 LAB — CBC
HCT: 25.8 % — ABNORMAL LOW (ref 36.0–46.0)
Hemoglobin: 8.4 g/dL — ABNORMAL LOW (ref 12.0–15.0)
MCH: 25.8 pg — ABNORMAL LOW (ref 26.0–34.0)
MCHC: 32.6 g/dL (ref 30.0–36.0)
MCV: 79.4 fL — ABNORMAL LOW (ref 80.0–100.0)
Platelets: 175 10*3/uL (ref 150–400)
RBC: 3.25 MIL/uL — ABNORMAL LOW (ref 3.87–5.11)
RDW: 17.5 % — ABNORMAL HIGH (ref 11.5–15.5)
WBC: 10.2 10*3/uL (ref 4.0–10.5)
nRBC: 0 % (ref 0.0–0.2)

## 2020-03-05 LAB — BASIC METABOLIC PANEL
Anion gap: 9 (ref 5–15)
BUN: 13 mg/dL (ref 6–20)
CO2: 22 mmol/L (ref 22–32)
Calcium: 7.7 mg/dL — ABNORMAL LOW (ref 8.9–10.3)
Chloride: 108 mmol/L (ref 98–111)
Creatinine, Ser: 0.63 mg/dL (ref 0.44–1.00)
GFR calc Af Amer: >60 mL/min (ref >60)
GFR calc non Af Amer: >60 mL/min (ref >60)
Glucose, Bld: 124 mg/dL — ABNORMAL HIGH (ref 70–99)
Potassium: 3.2 mmol/L — ABNORMAL LOW (ref 3.5–5.1)
Sodium: 139 mmol/L (ref 135–145)

## 2020-03-05 LAB — CULTURE, BLOOD (ROUTINE X 2): Special Requests: ADEQUATE

## 2020-03-05 MED ORDER — BISOPROLOL FUMARATE 5 MG PO TABS
10.0000 mg | ORAL_TABLET | Freq: Every day | ORAL | Status: DC
  Administered 2020-03-05 – 2020-03-07 (×3): 10 mg via ORAL
  Filled 2020-03-05 (×3): qty 2

## 2020-03-05 MED ORDER — POTASSIUM CHLORIDE CRYS ER 20 MEQ PO TBCR
40.0000 meq | EXTENDED_RELEASE_TABLET | Freq: Two times a day (BID) | ORAL | Status: AC
  Administered 2020-03-05 (×2): 40 meq via ORAL
  Filled 2020-03-05 (×2): qty 2

## 2020-03-05 MED ORDER — FUROSEMIDE 10 MG/ML IJ SOLN
20.0000 mg | Freq: Once | INTRAMUSCULAR | Status: AC
  Administered 2020-03-05: 20 mg via INTRAVENOUS
  Filled 2020-03-05: qty 2

## 2020-03-05 NOTE — Progress Notes (Signed)
PROGRESS NOTE    Meghan [name]  IEP:[id number] DOB: June 01, 1971 DOA: 03/03/2020 PCP: Patient, No Pcp Per   Brief Narrative: 49 year old with past medical history significant for hypertension, morbid obesity, GERD she said with sudden onset of left-sided flank pain accompanied with nausea, vomiting and fever.  Symptoms started 3 days prior to admission.  She was evaluated in the ED the day prior to admission CT showed obstructing 4 mm left UVJ stone on with mild left hydronephrosis.  Urology was contacted curbside recommended oral antibiotics and follow-up as an outpatient.  Patient went home but developed high fever, nausea vomiting unable to keep antibiotics presented to the hospital for further treatment.  Patient admitted with sepsis secondary to obstructing 4 mm left UVJ stone pyelonephritis.  She underwent cystoscopy and left ureteral stent placement on 03/03/2020   Assessment & Plan:   Active Problems:   Streptococcal sepsis, unspecified (HCC)   Sepsis (HCC)   1-Sepsis secondary to pyelonephritis secondary to left side obstructive uropathy left UV J stone; Patient received 30 cc/kg IV boluses. Urine culture from 6/3 growing E. Coli  Blood cultures growing Proteus, awaiting sensitivity  Continue with  with IV ceftriaxone day 3. Lactic acid and leukocytosis trending.    2-Proteous Bacteremia;  Continue with ceftriaxone.  Repeat Blood cultures tomorrow.   Left-sided obstructive uropathy secondary to left UVJ stone: Underwent cystoscopy and left ureteral stent placement on 6//2021 -Continue with supportive care. -Urology recommended follow-up in 2 to 3 weeks to have stent removal and final treatment for stone.   AKI with anion gap metabolic acidosis Secondary to obstructive uropathy and sepsis Improved with IV fluids  Hypertension: Hold losartan/hydrochlorothiazide due to AKI and sepsis Resume bystolic.   Anemia; check anemia panel.   Acute hypoxic respiratory  failure; Chest x-ray with bilateral pulmonary edema NSL IV lasix today.   Estimated body mass index is 31.76 kg/m as calculated from the following:   Height as of this encounter: 5\' 4"  (1.626 m).   Weight as of this encounter: 83.9 kg.   DVT prophylaxis: Lovenox Code Status: Full code Family Communication: care discussed with daughter  Disposition Plan:  Status is: Inpatient  Remains inpatient appropriate because:Hemodynamically unstable   Dispo: The patient is from: Home              Anticipated d/c is to: Home              Anticipated d/c date is: 3 days              Patient currently is not medically stable to d/c.        Consultants:   Urology   Procedures:     Antimicrobials:  Ceftriaxone  Subjective: She was able to eat some fruits last night. Nausea and vomiting improved.  Dizziness resolving   Objective: Vitals:   03/05/20 0400 03/05/20 0600 03/05/20 0835 03/05/20 1228  BP: (!) 150/85 (!) 145/89 (!) 142/91 131/72  Pulse: 97  74 93  Resp: (!) 32 (!) 30 (!) 25 16  Temp: 98.9 F (37.2 C)  97.6 F (36.4 C) 99.8 F (37.7 C)  TempSrc: Oral  Axillary Oral  SpO2: 90%  98% 95%  Weight:      Height:        Intake/Output Summary (Last 24 hours) at 03/05/2020 1236 Last data filed at 03/05/2020 0610 Gross per 24 hour  Intake 1840 ml  Output 1450 ml  Net 390 ml   Filed Weights   03/03/20 1110  Weight: 83.9 kg    Examination:  General exam: NAD Respiratory system: Bilateral carckles Cardiovascular system: S 1, S 2 RRR Gastrointestinal system: BS present, soft, nt Central nervous system: non focal.  Extremities: symmetric power   Data Reviewed: I have personally reviewed following labs and imaging studies  CBC: Recent Labs  Lab 03/02/20 1017 03/03/20 1142 03/04/20 0350 03/05/20 1010  WBC 10.7* 12.2* 18.0* 10.2  NEUTROABS  --  10.5*  --   --   HGB 11.2* 11.1* 9.2* 8.4*  HCT 35.5* 35.9* 28.8* 25.8*  MCV 80.9 81.8 81.1 79.4*  PLT 313  168 109* 175   Basic Metabolic Panel: Recent Labs  Lab 03/02/20 1017 03/03/20 1142 03/04/20 0350 03/05/20 1010  NA 137 135 143 139  K 3.4* 2.9* 3.5 3.2*  CL 100 98 109 108  CO2 23 18* 24 22  GLUCOSE 133* 126* 171* 124*  BUN 10 24* 16 13  CREATININE 0.75 2.28* 0.91 0.63  CALCIUM 8.9 8.2* 7.7* 7.7*   GFR: Estimated Creatinine Clearance: 89.2 mL/min (by C-G formula based on SCr of 0.63 mg/dL). Liver Function Tests: Recent Labs  Lab 03/02/20 1017  AST 26  ALT 16  ALKPHOS 68  BILITOT 0.6  PROT 7.6  ALBUMIN 3.6   Recent Labs  Lab 03/02/20 1017  LIPASE 20   No results for input(s): AMMONIA in the last 168 hours. Coagulation Profile: Recent Labs  Lab 03/03/20 1245  INR 1.6*   Cardiac Enzymes: No results for input(s): CKTOTAL, CKMB, CKMBINDEX, TROPONINI in the last 168 hours. BNP (last 3 results) No results for input(s): PROBNP in the last 8760 hours. HbA1C: No results for input(s): HGBA1C in the last 72 hours. CBG: No results for input(s): GLUCAP in the last 168 hours. Lipid Profile: No results for input(s): CHOL, HDL, LDLCALC, TRIG, CHOLHDL, LDLDIRECT in the last 72 hours. Thyroid Function Tests: No results for input(s): TSH, T4TOTAL, FREET4, T3FREE, THYROIDAB in the last 72 hours. Anemia Panel: No results for input(s): VITAMINB12, FOLATE, FERRITIN, TIBC, IRON, RETICCTPCT in the last 72 hours. Sepsis Labs: Recent Labs  Lab 03/03/20 1142 03/03/20 1245 03/04/20 1413 03/04/20 1626  LATICACIDVEN 4.8* 4.6* 2.9* 3.4*    Recent Results (from the past 240 hour(s))  Urine culture     Status: Abnormal   Collection Time: 03/02/20 10:03 AM   Specimen: Urine, Catheterized  Result Value Ref Range Status   Specimen Description URINE, CATHETERIZED  Final   Special Requests NONE  Final   Culture (A)  Final    40,000 COLONIES/mL ESCHERICHIA COLI 20,000 COLONIES/mL GROUP B STREP(S.AGALACTIAE)ISOLATED TESTING AGAINST S. AGALACTIAE NOT ROUTINELY PERFORMED DUE TO  PREDICTABILITY OF AMP/PEN/VAN SUSCEPTIBILITY. Performed at [facility], [address], [place], [place] [address]    Report Status 03/04/2020 FINAL  Final   Organism ID, Bacteria ESCHERICHIA COLI (A)  Final      Susceptibility   Escherichia coli - MIC*    AMPICILLIN >=32 RESISTANT Resistant     CEFAZOLIN <=4 SENSITIVE Sensitive     CEFTRIAXONE <=1 SENSITIVE Sensitive     CIPROFLOXACIN <=0.25 SENSITIVE Sensitive     GENTAMICIN <=1 SENSITIVE Sensitive     IMIPENEM <=0.25 SENSITIVE Sensitive     NITROFURANTOIN 64 INTERMEDIATE Intermediate     TRIMETH/SULFA >=320 RESISTANT Resistant     AMPICILLIN/SULBACTAM 8 SENSITIVE Sensitive     PIP/TAZO <=4 SENSITIVE Sensitive     * 40,000 COLONIES/mL ESCHERICHIA COLI  Culture, blood (routine x 2)  Status: Abnormal   Collection Time: 03/03/20 11:42 AM   Specimen: BLOOD LEFT FOREARM  Result Value Ref Range Status   Specimen Description BLOOD LEFT FOREARM  Final   Special Requests   Final    BOTTLES DRAWN AEROBIC AND ANAEROBIC Blood Culture adequate volume   Culture  Setup Time   Final    IN BOTH AEROBIC AND ANAEROBIC BOTTLES GRAM NEGATIVE RODS CRITICAL RESULT CALLED TO, READ BACK BY AND VERIFIED WITH: Lytle Butte [facility] 03/04/20 0103 JDW Performed at [facility], [address], [place], [place] [address]    Culture PROTEUS MIRABILIS (A)  Final   Report Status 03/05/2020 FINAL  Final   Organism ID, Bacteria PROTEUS MIRABILIS  Final      Susceptibility   Proteus mirabilis - MIC*    AMPICILLIN >=32 RESISTANT Resistant     CEFAZOLIN 8 SENSITIVE Sensitive     CEFEPIME <=1 SENSITIVE Sensitive     CEFTAZIDIME <=1 SENSITIVE Sensitive     CEFTRIAXONE <=1 SENSITIVE Sensitive     CIPROFLOXACIN <=0.25 SENSITIVE Sensitive     GENTAMICIN <=1 SENSITIVE Sensitive     IMIPENEM 2 SENSITIVE Sensitive     TRIMETH/SULFA <=20 SENSITIVE Sensitive     AMPICILLIN/SULBACTAM 16 INTERMEDIATE Intermediate     PIP/TAZO <=4 SENSITIVE Sensitive     *  PROTEUS MIRABILIS  Blood Culture ID Panel (Reflexed)     Status: Abnormal   Collection Time: 03/03/20 11:42 AM  Result Value Ref Range Status   Enterococcus species NOT DETECTED NOT DETECTED Final   Listeria monocytogenes NOT DETECTED NOT DETECTED Final   Staphylococcus species NOT DETECTED NOT DETECTED Final   Staphylococcus aureus (BCID) NOT DETECTED NOT DETECTED Final   Streptococcus species NOT DETECTED NOT DETECTED Final   Streptococcus agalactiae NOT DETECTED NOT DETECTED Final   Streptococcus pneumoniae NOT DETECTED NOT DETECTED Final   Streptococcus pyogenes NOT DETECTED NOT DETECTED Final   Acinetobacter baumannii NOT DETECTED NOT DETECTED Final   Enterobacteriaceae species DETECTED (A) NOT DETECTED Final    Comment: Enterobacteriaceae represent a large family of gram-negative bacteria, not a single organism. CRITICAL RESULT CALLED TO, READ BACK BY AND VERIFIED WITH: Lytle Butte [facility] 03/04/20 0103 JDW    Enterobacter cloacae complex NOT DETECTED NOT DETECTED Final   Escherichia coli NOT DETECTED NOT DETECTED Final   Klebsiella oxytoca NOT DETECTED NOT DETECTED Final   Klebsiella pneumoniae NOT DETECTED NOT DETECTED Final   Proteus species DETECTED (A) NOT DETECTED Final    Comment: CRITICAL RESULT CALLED TO, READ BACK BY AND VERIFIED WITH: M MACCIA PHARMD 03/04/20 0103 JDW    Serratia marcescens NOT DETECTED NOT DETECTED Final   Carbapenem resistance NOT DETECTED NOT DETECTED Final   Haemophilus influenzae NOT DETECTED NOT DETECTED Final   Neisseria meningitidis NOT DETECTED NOT DETECTED Final   Pseudomonas aeruginosa NOT DETECTED NOT DETECTED Final   Candida albicans NOT DETECTED NOT DETECTED Final   Candida glabrata NOT DETECTED NOT DETECTED Final   Candida krusei NOT DETECTED NOT DETECTED Final   Candida parapsilosis NOT DETECTED NOT DETECTED Final   Candida tropicalis NOT DETECTED NOT DETECTED Final    Comment: Performed at [facility], [address]  [place], [place] [address]  SARS Coronavirus 2 by RT PCR (hospital order, performed in [facility] hospital lab) Nasopharyngeal Nasopharyngeal Swab     Status: None   Collection Time: 03/03/20 12:06 PM   Specimen: Nasopharyngeal Swab  Result Value Ref Range Status  SARS Coronavirus 2 NEGATIVE NEGATIVE Final    Comment: (NOTE) SARS-CoV-2 target nucleic acids are NOT DETECTED. The SARS-CoV-2 RNA is generally detectable in upper and lower respiratory specimens during the acute phase of infection. The lowest concentration of SARS-CoV-2 viral copies this assay can detect is 250 copies / mL. A negative result does not preclude SARS-CoV-2 infection and should not be used as the sole basis for treatment or other patient management decisions.  A negative result may occur with improper specimen collection / handling, submission of specimen other than nasopharyngeal swab, presence of viral mutation(s) within the areas targeted by this assay, and inadequate number of viral copies (<250 copies / mL). A negative result must be combined with clinical observations, patient history, and epidemiological information. Fact Sheet for Patients:   StrictlyIdeas.no Fact Sheet for Healthcare Providers: BankingDealers.co.za This test is not yet approved or cleared  by the [place] FDA and has been authorized for detection and/or diagnosis of SARS-CoV-2 by FDA under an Emergency Use Authorization (EUA).  This EUA will remain in effect (meaning this test can be used) for the duration of the COVID-19 declaration under Section 564(b)(1) of the Act, 21 U.S.C. section 360bbb-3(b)(1), unless the authorization is terminated or revoked sooner. Performed at [facility], [place] [address], [place], [place] [address]   Culture, blood (routine x 2)     Status: None (Preliminary result)   Collection Time: 03/03/20 12:45 PM   Specimen: BLOOD RIGHT WRIST  Result Value Ref  Range Status   Specimen Description BLOOD RIGHT WRIST  Final   Special Requests   Final    BOTTLES DRAWN AEROBIC AND ANAEROBIC Blood Culture results may not be optimal due to an inadequate volume of blood received in culture bottles   Culture   Final    NO GROWTH < 24 HOURS Performed at [facility], [place] [address] [place], [place] [address]    Report Status PENDING  Incomplete         Radiology Studies: DG CHEST PORT 1 VIEW  Result Date: 03/04/2020 CLINICAL DATA:  Shortness of breath, abdominal pain EXAM: PORTABLE CHEST 1 VIEW COMPARISON:  03/03/2020 FINDINGS: Mild cardiomegaly. New diffuse bilateral interstitial opacity. The visualized skeletal structures are unremarkable. IMPRESSION: Mild cardiomegaly with new diffuse bilateral interstitial pulmonary opacity, consistent with edema. No focal airspace opacity. Electronically Signed   By: Eddie Candle M.D.   On: 03/04/2020 11:35        Scheduled Meds:  bisoprolol  10 mg Oral Daily   Chlorhexidine Gluconate Cloth  6 each Topical Daily   furosemide  20 mg Intravenous Once   heparin  5,000 Units Subcutaneous Q8H   potassium chloride  40 mEq Oral BID   Continuous Infusions:  cefTRIAXone (ROCEPHIN)  IV 2 g (03/05/20 1028)     LOS: 2 days    Time spent: 35 minutes.     Elmarie Shiley, MD [facility]   If 7PM-7AM, please contact night-coverage [email or web address]  03/05/2020, 12:36 PM

## 2020-03-06 LAB — FERRITIN: Ferritin: 48 ng/mL (ref 11–307)

## 2020-03-06 LAB — RETICULOCYTES
Immature Retic Fract: 12 % (ref 2.3–15.9)
RBC.: 3.37 MIL/uL — ABNORMAL LOW (ref 3.87–5.11)
Retic Count, Absolute: 23.9 10*3/uL (ref 19.0–186.0)
Retic Ct Pct: 0.7 % (ref 0.4–3.1)

## 2020-03-06 LAB — PROTIME-INR
INR: 1.1 (ref 0.8–1.2)
Prothrombin Time: 14 seconds (ref 11.4–15.2)

## 2020-03-06 LAB — VITAMIN B12: Vitamin B-12: 4301 pg/mL — ABNORMAL HIGH (ref 180–914)

## 2020-03-06 LAB — IRON AND TIBC
Iron: 17 ug/dL — ABNORMAL LOW (ref 28–170)
Saturation Ratios: 5 % — ABNORMAL LOW (ref 10.4–31.8)
TIBC: 319 ug/dL (ref 250–450)
UIBC: 302 ug/dL

## 2020-03-06 LAB — FOLATE: Folate: 22.5 ng/mL (ref >5.9)

## 2020-03-06 MED ORDER — FERROUS SULFATE 325 (65 FE) MG PO TABS
325.0000 mg | ORAL_TABLET | Freq: Every day | ORAL | Status: DC
  Administered 2020-03-06 – 2020-03-07 (×2): 325 mg via ORAL
  Filled 2020-03-06 (×2): qty 1

## 2020-03-06 MED ORDER — POTASSIUM CHLORIDE CRYS ER 20 MEQ PO TBCR
40.0000 meq | EXTENDED_RELEASE_TABLET | Freq: Once | ORAL | Status: AC
  Administered 2020-03-06: 40 meq via ORAL
  Filled 2020-03-06: qty 2

## 2020-03-06 MED ORDER — GUAIFENESIN-DM 100-10 MG/5ML PO SYRP
5.0000 mL | ORAL_SOLUTION | ORAL | Status: DC | PRN
  Administered 2020-03-06 – 2020-03-07 (×2): 5 mL via ORAL
  Filled 2020-03-06 (×2): qty 5

## 2020-03-06 NOTE — Anesthesia Postprocedure Evaluation (Signed)
Anesthesia Post Note  Patient: Meghan [name]  Procedure(s) Performed: CYSTOSCOPY  STENT PLACEMENT (Left Bladder)     Patient location during evaluation: PACU Anesthesia Type: General Level of consciousness: awake and alert Pain management: pain level controlled Vital Signs Assessment: post-procedure vital signs reviewed and stable Respiratory status: spontaneous breathing, nonlabored ventilation and respiratory function stable Cardiovascular status: blood pressure returned to baseline and stable Postop Assessment: no apparent nausea or vomiting Anesthetic complications: no    Last Vitals:  Vitals:   03/06/20 0000 03/06/20 0303  BP: 131/73 (!) 136/94  Pulse: 81 61  Resp: 20 18  Temp: 37.1 C 37.7 C  SpO2: 99% 99%    Last Pain:  Vitals:   03/06/20 0303  TempSrc: Oral  PainSc: 0-No pain                 Lowella Curb

## 2020-03-06 NOTE — Evaluation (Signed)
Physical Therapy Evaluation Patient Details Name: Meghan [name] MRN: [id number] DOB: Jan 16, 1971 Today's Date: 03/06/2020   History of Present Illness  49 year old pt who presents with N/V, fever and admitted with sepsis secondary to obstructing 4 mm left UVJ stone pyelonephritis.  s/p cystoscopy and left ureteral stent placement on 03/03/2020 with PMHx: for HTN, morbid obesity, GERD.  Clinical Impression  Patient reports being independent with ADLs and IADLs PTA. Today, pt tolerated bed mobility, transfers and ambulation Mod I without difficulty. Reports no pain. VSS on RA. Pt eager to return home and glad to be feeling better post surgery. Encouraged walking while in the hospital. Pt does not require skilled therapy services as pt functioning close to baseline. All education completed. Discharge from therapy.    Follow Up Recommendations No PT follow up;Supervision - Intermittent    Equipment Recommendations  None recommended by PT    Recommendations for Other Services       Precautions / Restrictions Precautions Precautions: None Restrictions Weight Bearing Restrictions: No      Mobility  Bed Mobility Overal bed mobility: Independent             General bed mobility comments: No assist needed.  Transfers Overall transfer level: Modified independent Equipment used: None             General transfer comment: Stood from EOB x1, from toilet x1, no balance deficits noted.  Ambulation/Gait Ambulation/Gait assistance: Modified independent (Device/Increase time) Gait Distance (Feet): 470 Feet Assistive device: None Gait Pattern/deviations: WFL(Within Functional Limits) Gait velocity: 2.17 ft/sec Gait velocity interpretation: 1.31 - 2.62 ft/sec, indicative of limited community ambulator General Gait Details: Slow, steady gait.  Stairs            Wheelchair Mobility    Modified Rankin (Stroke Patients Only)       Balance Overall balance assessment: No  apparent balance deficits (not formally assessed)                                           Pertinent Vitals/Pain Pain Assessment: No/denies pain    Home Living Family/patient expects to be discharged to:: Private residence Living Arrangements: Spouse/significant other Available Help at Discharge: Family;Available PRN/intermittently Type of Home: House Home Access: Level entry     Home Layout: One level Home Equipment: None      Prior Function Level of Independence: Independent         Comments: working, Animator   Dominant Hand: Right    Extremity/Trunk Assessment   Upper Extremity Assessment Upper Extremity Assessment: Defer to OT evaluation    Lower Extremity Assessment Lower Extremity Assessment: Overall WFL for tasks assessed    Cervical / Trunk Assessment Cervical / Trunk Assessment: Normal  Communication   Communication: Prefers language other than English  Cognition Arousal/Alertness: Awake/alert Behavior During Therapy: WFL for tasks assessed/performed Overall Cognitive Status: Within Functional Limits for tasks assessed                                        General Comments General comments (skin integrity, edema, etc.): VSS on RA. Daughters present during session.    Exercises     Assessment/Plan    PT Assessment Patent does not need any further PT services  PT  Problem List         PT Treatment Interventions      PT Goals (Current goals can be found in the Care Plan section)  Acute Rehab PT Goals Patient Stated Goal: to go home PT Goal Formulation: All assessment and education complete, DC therapy    Frequency     Barriers to discharge        Co-evaluation               AM-PAC PT "6 Clicks" Mobility  Outcome Measure Help needed turning from your back to your side while in a flat bed without using bedrails?: None Help needed moving from lying on your back to sitting  on the side of a flat bed without using bedrails?: None Help needed moving to and from a bed to a chair (including a wheelchair)?: None Help needed standing up from a chair using your arms (e.g., wheelchair or bedside chair)?: None Help needed to walk in hospital room?: None Help needed climbing 3-5 steps with a railing? : A Little 6 Click Score: 23    End of Session   Activity Tolerance: Patient tolerated treatment well Patient left: in bed;with call bell/phone within reach;with family/visitor present Nurse Communication: Mobility status PT Visit Diagnosis: Muscle weakness (generalized) (M62.81)    Time: 6213-0865 PT Time Calculation (min) (ACUTE ONLY): 13 min   Charges:   PT Evaluation $PT Eval Moderate Complexity: 1 Mod          Marisa Severin, PT, DPT Acute Rehabilitation Services Pager [phone] Office [phone]      [place] 03/06/2020, 11:32 AM

## 2020-03-06 NOTE — Progress Notes (Signed)
Mobility Specialist: Progress Note    03/06/20 1739  Mobility  Activity Ambulated in hall  Level of Assistance Independent  Assistive Device None  Distance Ambulated (ft) 1000 ft  Mobility Response Tolerated well  Mobility performed by Mobility specialist  $Mobility charge 1 Mobility   Pre-Mobility: 93 HR, 150/83 BP, 93% SpO2 Post-Mobility: 103 HR, 144/79 BP, 93% SpO2  Pt tolerated ambulation well and had no c/o.  [facility] Syncere Kaminski Mobility Specialist

## 2020-03-06 NOTE — Progress Notes (Signed)
PROGRESS NOTE    Meghan [name]  ZOX:[id number] DOB: 1970/12/15 DOA: 03/03/2020 PCP: Patient, No Pcp Per   Brief Narrative: 49 year old with past medical history significant for hypertension, morbid obesity, GERD she said with sudden onset of left-sided flank pain accompanied with nausea, vomiting and fever.  Symptoms started 3 days prior to admission.  She was evaluated in the ED the day prior to admission CT showed obstructing 4 mm left UVJ stone on with mild left hydronephrosis.  Urology was contacted curbside recommended oral antibiotics and follow-up as an outpatient.  Patient went home but developed high fever, nausea vomiting unable to keep antibiotics presented to the hospital for further treatment.  Patient admitted with sepsis secondary to obstructing 4 mm left UVJ stone pyelonephritis.  She underwent cystoscopy and left ureteral stent placement on 03/03/2020   Assessment & Plan:   Active Problems:   Streptococcal sepsis, unspecified (HCC)   Sepsis (HCC)   1-Sepsis secondary to pyelonephritis secondary to left side obstructive uropathy left UV J stone; Patient received 30 cc/kg IV boluses. Urine culture from 6/3 growing E. Coli  Blood cultures growing Proteus,  Continue with  with IV ceftriaxone day 4. Lactic acid and leukocytosis trending.  Plan  to discharge on Bactrim per pharmacist MIC for cefazolin is high and keflex might not work well.   2-Proteous Bacteremia;  Continue with ceftriaxone.  Repeat Blood cultures follow result.  Plan  to discharge on Bactrim.   Left-sided obstructive uropathy secondary to left UVJ stone: Underwent cystoscopy and left ureteral stent placement on 6//2021 -Continue with supportive care. -Urology recommended follow-up in 2 to 3 weeks to have stent removal and final treatment for stone.   AKI with anion gap metabolic acidosis Secondary to obstructive uropathy and sepsis Improved with IV fluids  Hypertension: Hold  losartan/hydrochlorothiazide due to AKI and sepsis Resume bystolic.   Anemia; check anemia panel.   Acute hypoxic respiratory failure; Acute pulmonary edema for fluid resuscitation  Chest x-ray with bilateral pulmonary edema NSL IV lasix today.   Estimated body mass index is 31.76 kg/m as calculated from the following:   Height as of this encounter: 5\' 4"  (1.626 m).   Weight as of this encounter: 83.9 kg.   DVT prophylaxis: Lovenox Code Status: Full code Family Communication: care discussed with daughter  Disposition Plan:  Status is: Inpatient  Remains inpatient appropriate because:Hemodynamically unstable   Dispo: The patient is from: Home              Anticipated d/c is to: Home              Anticipated d/c date is: 1 day              Patient currently is not medically stable to d/c.        Consultants:   Urology   Procedures:     Antimicrobials:  Ceftriaxone  Subjective: Feeling better, dyspnea improved.    Objective: Vitals:   03/06/20 0000 03/06/20 0303 03/06/20 0745 03/06/20 1145  BP: 131/73 (!) 136/94 139/81 132/73  Pulse: 81 61 89 88  Resp: 20 18 20  (!) 27  Temp: 98.7 F (37.1 C) 99.9 F (37.7 C) 99.2 F (37.3 C) 98.5 F (36.9 C)  TempSrc: Oral Oral Oral Oral  SpO2: 99% 99% 98% 98%  Weight:      Height:        Intake/Output Summary (Last 24 hours) at 03/06/2020 1241 Last data filed at 03/06/2020 0841 Gross per 24 hour  Intake 120 ml  Output 1500 ml  Net -1380 ml   Filed Weights   03/03/20 1110  Weight: 83.9 kg    Examination:  General exam: NAD Respiratory system: Less crackles.  Cardiovascular system: S 1, S 2 RRR Gastrointestinal system: BS present, soft, nt Central nervous system: Non focal.  Extremities: symmetric power   Data Reviewed: I have personally reviewed following labs and imaging studies  CBC: Recent Labs  Lab 03/02/20 1017 03/03/20 1142 03/04/20 0350 03/05/20 1010  WBC 10.7* 12.2* 18.0* 10.2    NEUTROABS  --  10.5*  --   --   HGB 11.2* 11.1* 9.2* 8.4*  HCT 35.5* 35.9* 28.8* 25.8*  MCV 80.9 81.8 81.1 79.4*  PLT 313 168 109* 175   Basic Metabolic Panel: Recent Labs  Lab 03/02/20 1017 03/03/20 1142 03/04/20 0350 03/05/20 1010  NA 137 135 143 139  K 3.4* 2.9* 3.5 3.2*  CL 100 98 109 108  CO2 23 18* 24 22  GLUCOSE 133* 126* 171* 124*  BUN 10 24* 16 13  CREATININE 0.75 2.28* 0.91 0.63  CALCIUM 8.9 8.2* 7.7* 7.7*   GFR: Estimated Creatinine Clearance: 89.2 mL/min (by C-G formula based on SCr of 0.63 mg/dL). Liver Function Tests: Recent Labs  Lab 03/02/20 1017  AST 26  ALT 16  ALKPHOS 68  BILITOT 0.6  PROT 7.6  ALBUMIN 3.6   Recent Labs  Lab 03/02/20 1017  LIPASE 20   No results for input(s): AMMONIA in the last 168 hours. Coagulation Profile: Recent Labs  Lab 03/03/20 1245 03/06/20 1005  INR 1.6* 1.1   Cardiac Enzymes: No results for input(s): CKTOTAL, CKMB, CKMBINDEX, TROPONINI in the last 168 hours. BNP (last 3 results) No results for input(s): PROBNP in the last 8760 hours. HbA1C: No results for input(s): HGBA1C in the last 72 hours. CBG: No results for input(s): GLUCAP in the last 168 hours. Lipid Profile: No results for input(s): CHOL, HDL, LDLCALC, TRIG, CHOLHDL, LDLDIRECT in the last 72 hours. Thyroid Function Tests: No results for input(s): TSH, T4TOTAL, FREET4, T3FREE, THYROIDAB in the last 72 hours. Anemia Panel: Recent Labs    03/06/20 0035  VITAMINB12 4,301*  FOLATE 22.5  FERRITIN 48  TIBC 319  IRON 17*  RETICCTPCT 0.7   Sepsis Labs: Recent Labs  Lab 03/03/20 1142 03/03/20 1245 03/04/20 1413 03/04/20 1626  LATICACIDVEN 4.8* 4.6* 2.9* 3.4*    Recent Results (from the past 240 hour(s))  Urine culture     Status: Abnormal   Collection Time: 03/02/20 10:03 AM   Specimen: Urine, Catheterized  Result Value Ref Range Status   Specimen Description URINE, CATHETERIZED  Final   Special Requests NONE  Final   Culture (A)   Final    40,000 COLONIES/mL ESCHERICHIA COLI 20,000 COLONIES/mL GROUP B STREP(S.AGALACTIAE)ISOLATED TESTING AGAINST S. AGALACTIAE NOT ROUTINELY PERFORMED DUE TO PREDICTABILITY OF AMP/PEN/VAN SUSCEPTIBILITY. Performed at [facility], [address] [place], [place] [address]    Report Status 03/04/2020 FINAL  Final   Organism ID, Bacteria ESCHERICHIA COLI (A)  Final      Susceptibility   Escherichia coli - MIC*    AMPICILLIN >=32 RESISTANT Resistant     CEFAZOLIN <=4 SENSITIVE Sensitive     CEFTRIAXONE <=1 SENSITIVE Sensitive     CIPROFLOXACIN <=0.25 SENSITIVE Sensitive     GENTAMICIN <=1 SENSITIVE Sensitive     IMIPENEM <=0.25 SENSITIVE Sensitive     NITROFURANTOIN 64 INTERMEDIATE Intermediate     TRIMETH/SULFA >=320  RESISTANT Resistant     AMPICILLIN/SULBACTAM 8 SENSITIVE Sensitive     PIP/TAZO <=4 SENSITIVE Sensitive     * 40,000 COLONIES/mL ESCHERICHIA COLI  Culture, blood (routine x 2)     Status: Abnormal   Collection Time: 03/03/20 11:42 AM   Specimen: BLOOD LEFT FOREARM  Result Value Ref Range Status   Specimen Description BLOOD LEFT FOREARM  Final   Special Requests   Final    BOTTLES DRAWN AEROBIC AND ANAEROBIC Blood Culture adequate volume   Culture  Setup Time   Final    IN BOTH AEROBIC AND ANAEROBIC BOTTLES GRAM NEGATIVE RODS CRITICAL RESULT CALLED TO, READ BACK BY AND VERIFIED WITH: Hughie Closs [facility] 03/04/20 0103 JDW Performed at [facility], [place] [address], [place], [place] [address]    Culture PROTEUS MIRABILIS (A)  Final   Report Status 03/05/2020 FINAL  Final   Organism ID, Bacteria PROTEUS MIRABILIS  Final      Susceptibility   Proteus mirabilis - MIC*    AMPICILLIN >=32 RESISTANT Resistant     CEFAZOLIN 8 SENSITIVE Sensitive     CEFEPIME <=1 SENSITIVE Sensitive     CEFTAZIDIME <=1 SENSITIVE Sensitive     CEFTRIAXONE <=1 SENSITIVE Sensitive     CIPROFLOXACIN <=0.25 SENSITIVE Sensitive     GENTAMICIN <=1 SENSITIVE Sensitive     IMIPENEM 2  SENSITIVE Sensitive     TRIMETH/SULFA <=20 SENSITIVE Sensitive     AMPICILLIN/SULBACTAM 16 INTERMEDIATE Intermediate     PIP/TAZO <=4 SENSITIVE Sensitive     * PROTEUS MIRABILIS  Blood Culture ID Panel (Reflexed)     Status: Abnormal   Collection Time: 03/03/20 11:42 AM  Result Value Ref Range Status   Enterococcus species NOT DETECTED NOT DETECTED Final   Listeria monocytogenes NOT DETECTED NOT DETECTED Final   Staphylococcus species NOT DETECTED NOT DETECTED Final   Staphylococcus aureus (BCID) NOT DETECTED NOT DETECTED Final   Streptococcus species NOT DETECTED NOT DETECTED Final   Streptococcus agalactiae NOT DETECTED NOT DETECTED Final   Streptococcus pneumoniae NOT DETECTED NOT DETECTED Final   Streptococcus pyogenes NOT DETECTED NOT DETECTED Final   Acinetobacter baumannii NOT DETECTED NOT DETECTED Final   Enterobacteriaceae species DETECTED (A) NOT DETECTED Final    Comment: Enterobacteriaceae represent a large family of gram-negative bacteria, not a single organism. CRITICAL RESULT CALLED TO, READ BACK BY AND VERIFIED WITH: Hughie Closs PHARMD 03/04/20 0103 JDW    Enterobacter cloacae complex NOT DETECTED NOT DETECTED Final   Escherichia coli NOT DETECTED NOT DETECTED Final   Klebsiella oxytoca NOT DETECTED NOT DETECTED Final   Klebsiella pneumoniae NOT DETECTED NOT DETECTED Final   Proteus species DETECTED (A) NOT DETECTED Final    Comment: CRITICAL RESULT CALLED TO, READ BACK BY AND VERIFIED WITH: M MACCIA PHARMD 03/04/20 0103 JDW    Serratia marcescens NOT DETECTED NOT DETECTED Final   Carbapenem resistance NOT DETECTED NOT DETECTED Final   Haemophilus influenzae NOT DETECTED NOT DETECTED Final   Neisseria meningitidis NOT DETECTED NOT DETECTED Final   Pseudomonas aeruginosa NOT DETECTED NOT DETECTED Final   Candida albicans NOT DETECTED NOT DETECTED Final   Candida glabrata NOT DETECTED NOT DETECTED Final   Candida krusei NOT DETECTED NOT DETECTED Final   Candida  parapsilosis NOT DETECTED NOT DETECTED Final   Candida tropicalis NOT DETECTED NOT DETECTED Final    Comment: Performed at [facility], [address], [place], [place] [address]  SARS Coronavirus 2 by RT PCR (  hospital order, performed in [facility] hospital lab) Nasopharyngeal Nasopharyngeal Swab     Status: None   Collection Time: 03/03/20 12:06 PM   Specimen: Nasopharyngeal Swab  Result Value Ref Range Status   SARS Coronavirus 2 NEGATIVE NEGATIVE Final    Comment: (NOTE) SARS-CoV-2 target nucleic acids are NOT DETECTED. The SARS-CoV-2 RNA is generally detectable in upper and lower respiratory specimens during the acute phase of infection. The lowest concentration of SARS-CoV-2 viral copies this assay can detect is 250 copies / mL. A negative result does not preclude SARS-CoV-2 infection and should not be used as the sole basis for treatment or other patient management decisions.  A negative result may occur with improper specimen collection / handling, submission of specimen other than nasopharyngeal swab, presence of viral mutation(s) within the areas targeted by this assay, and inadequate number of viral copies (<250 copies / mL). A negative result must be combined with clinical observations, patient history, and epidemiological information. Fact Sheet for Patients:   BoilerBrush.com.cy Fact Sheet for Healthcare Providers: https://pope.com/ This test is not yet approved or cleared  by the [place] FDA and has been authorized for detection and/or diagnosis of SARS-CoV-2 by FDA under an Emergency Use Authorization (EUA).  This EUA will remain in effect (meaning this test can be used) for the duration of the COVID-19 declaration under Section 564(b)(1) of the Act, 21 U.S.C. section 360bbb-3(b)(1), unless the authorization is terminated or revoked sooner. Performed at [facility], [address], [place],  [place] [address]   Culture, blood (routine x 2)     Status: None (Preliminary result)   Collection Time: 03/03/20 12:45 PM   Specimen: BLOOD RIGHT WRIST  Result Value Ref Range Status   Specimen Description BLOOD RIGHT WRIST  Final   Special Requests   Final    BOTTLES DRAWN AEROBIC AND ANAEROBIC Blood Culture results may not be optimal due to an inadequate volume of blood received in culture bottles   Culture   Final    NO GROWTH 3 DAYS Performed at [facility], [address], [place], [place] [address]    Report Status PENDING  Incomplete  Culture, blood (routine x 2)     Status: None (Preliminary result)   Collection Time: 03/06/20 12:35 AM   Specimen: BLOOD RIGHT ARM  Result Value Ref Range Status   Specimen Description BLOOD RIGHT ARM  Final   Special Requests   Final    BOTTLES DRAWN AEROBIC AND ANAEROBIC Blood Culture adequate volume   Culture   Final    NO GROWTH < 12 HOURS Performed at [facility], [address], [place], [place] [address]    Report Status PENDING  Incomplete  Culture, blood (routine x 2)     Status: None (Preliminary result)   Collection Time: 03/06/20 12:44 AM   Specimen: BLOOD RIGHT HAND  Result Value Ref Range Status   Specimen Description BLOOD RIGHT HAND  Final   Special Requests   Final    BOTTLES DRAWN AEROBIC AND ANAEROBIC Blood Culture adequate volume   Culture   Final    NO GROWTH < 12 HOURS Performed at [facility], [address] [place], [place] [address]    Report Status PENDING  Incomplete         Radiology Studies: No results found.      Scheduled Meds: . bisoprolol  10 mg Oral Daily  . Chlorhexidine Gluconate Cloth  6  each Topical Daily  . ferrous sulfate  325 mg Oral Q breakfast  . heparin  5,000 Units Subcutaneous Q8H   Continuous Infusions: . cefTRIAXone (ROCEPHIN)  IV 2 g (03/06/20 0809)     LOS: 3 days    Time spent: 35 minutes.     Alba Cory, MD [facility]   If  7PM-7AM, please contact night-coverage [email or web address]  03/06/2020, 12:41 PM

## 2020-03-06 NOTE — Evaluation (Signed)
Occupational Therapy Evaluation Patient Details Name: Meghan [name] MRN: [id number] DOB: 1971-09-16 Today's Date: 03/06/2020    History of Present Illness 49 year old pt admitted s/p N/V, fever with sepsis secondary to obstructing 4 mm left UVJ stone pyelonephritis.  She underwent cystoscopy and left ureteral stent placement on 03/03/2020 with PMHx: for HTN, morbid obesity, GERD.   Clinical Impression   Pt PTA: Pt independent with ADL and mobility; pt reports working. Pt currently with no focal deficits. Pt modified independent with ADL and mobility. Pt performing pericare; standing at sink for grooming/ADL tasks and pt requires no physical assist for LOB episodes. Pt does not require continued OT skilled services. OT signing off.     Follow Up Recommendations  No OT follow up    Equipment Recommendations  None recommended by OT    Recommendations for Other Services       Precautions / Restrictions Precautions Precautions: Fall Restrictions Weight Bearing Restrictions: No      Mobility Bed Mobility Overal bed mobility: Independent                Transfers Overall transfer level: Modified independent Equipment used: None                  Balance Overall balance assessment: No apparent balance deficits (not formally assessed)                                         ADL either performed or assessed with clinical judgement   ADL Overall ADL's : Modified independent                                       General ADL Comments: Pt does not require physical assist. Pt performing pericare; standing at sink for grooming/ADL tasks and pt requires no physical assist for LOB episodes.     Vision Baseline Vision/History: Wears glasses Wears Glasses: At all times;Distance only Patient Visual Report: No change from baseline Vision Assessment?: No apparent visual deficits     Perception     Praxis      Pertinent Vitals/Pain Pain  Assessment: No/denies pain     Hand Dominance Right   Extremity/Trunk Assessment Upper Extremity Assessment Upper Extremity Assessment: Overall WFL for tasks assessed   Lower Extremity Assessment Lower Extremity Assessment: Overall WFL for tasks assessed   Cervical / Trunk Assessment Cervical / Trunk Assessment: Normal   Communication Communication Communication: Prefers language other than English(Spanish)   Cognition Arousal/Alertness: Awake/alert Behavior During Therapy: WFL for tasks assessed/performed Overall Cognitive Status: Within Functional Limits for tasks assessed                                 General Comments: Spanish speaking only   General Comments  2 family members in room. Interpreter Carley Hammed [phone]; VSS. O2 remains >90%.    Exercises     Shoulder Instructions      Home Living Family/patient expects to be discharged to:: Private residence Living Arrangements: Spouse/significant other Available Help at Discharge: Family;Available PRN/intermittently Type of Home: House Home Access: Level entry     Home Layout: One level     Bathroom Shower/Tub: Chief Strategy Officer: Standard     Home Equipment: None  Prior Functioning/Environment Level of Independence: Independent        Comments: working, cooking        OT Problem List: Decreased activity tolerance      OT Treatment/Interventions:      OT Goals(Current goals can be found in the care plan section) Acute Rehab OT Goals Patient Stated Goal: to go home  OT Frequency:     Barriers to D/C:            Co-evaluation              AM-PAC OT "6 Clicks" Daily Activity     Outcome Measure Help from another person eating meals?: None Help from another person taking care of personal grooming?: None Help from another person toileting, which includes using toliet, bedpan, or urinal?: None Help from another person bathing (including washing,  rinsing, drying)?: None Help from another person to put on and taking off regular upper body clothing?: None Help from another person to put on and taking off regular lower body clothing?: None 6 Click Score: 24   End of Session Equipment Utilized During Treatment: Oxygen Nurse Communication: Mobility status  Activity Tolerance: Patient tolerated treatment well Patient left: in bed;with call bell/phone within reach;with family/visitor present  OT Visit Diagnosis: Unsteadiness on feet (R26.81)                Time: 7622-6333 OT Time Calculation (min): 26 min Charges:  OT General Charges $OT Visit: 1 Visit OT Evaluation $OT Eval Moderate Complexity: 1 Mod OT Treatments $Self Care/Home Management : 8-22 mins  Jefferey Pica, OTR/L Acute Rehabilitation Services Pager: [phone] Office: [phone]   Charnay Nazario C 03/06/2020, 4:42 PM

## 2020-03-07 ENCOUNTER — Other Ambulatory Visit: Payer: Self-pay | Admitting: Urology

## 2020-03-07 LAB — BASIC METABOLIC PANEL
Anion gap: 9 (ref 5–15)
BUN: 9 mg/dL (ref 6–20)
CO2: 22 mmol/L (ref 22–32)
Calcium: 8.6 mg/dL — ABNORMAL LOW (ref 8.9–10.3)
Chloride: 109 mmol/L (ref 98–111)
Creatinine, Ser: 0.58 mg/dL (ref 0.44–1.00)
GFR calc Af Amer: >60 mL/min (ref >60)
GFR calc non Af Amer: >60 mL/min (ref >60)
Glucose, Bld: 102 mg/dL — ABNORMAL HIGH (ref 70–99)
Potassium: 3.8 mmol/L (ref 3.5–5.1)
Sodium: 140 mmol/L (ref 135–145)

## 2020-03-07 MED ORDER — FERROUS SULFATE 325 (65 FE) MG PO TABS: 325.0000 mg | ORAL_TABLET | Freq: Every day | ORAL | 0 refills | Status: AC

## 2020-03-07 MED ORDER — FUROSEMIDE 20 MG PO TABS
20.0000 mg | ORAL_TABLET | Freq: Once | ORAL | Status: AC
  Administered 2020-03-07: 20 mg via ORAL
  Filled 2020-03-07: qty 1

## 2020-03-07 MED ORDER — GUAIFENESIN-DM 100-10 MG/5ML PO SYRP: 5.0000 mL | ORAL_SOLUTION | ORAL | 0 refills | Status: AC | PRN

## 2020-03-07 MED ORDER — POTASSIUM CHLORIDE CRYS ER 20 MEQ PO TBCR
40.0000 meq | EXTENDED_RELEASE_TABLET | Freq: Once | ORAL | Status: AC
  Administered 2020-03-07: 40 meq via ORAL
  Filled 2020-03-07: qty 2

## 2020-03-07 MED ORDER — CIPROFLOXACIN HCL 500 MG PO TABS
500.0000 mg | ORAL_TABLET | Freq: Two times a day (BID) | ORAL | 0 refills | Status: AC
Start: 2020-03-07 — End: 2020-03-17

## 2020-03-07 NOTE — Discharge Summary (Signed)
Physician Discharge Summary  Meghan [name] KVQ:[id number] DOB: [birth year] DOA: 03/03/2020  PCP: Patient, No Pcp Per  Admit date: 03/03/2020 Discharge date: 03/07/2020  Admitted From: Home  Disposition: Home   Recommendations for Outpatient Follow-up:  1. Follow up with PCP in 1-2 weeks 2. Please obtain BMP/CBC in one week 3. Needs to follow up with urology for stent removal and final treatment for stone.   Home Health:none  Discharge Condition: Stable.  CODE STATUS: Full code Diet recommendation: Heart Healthy   Brief/Interim Summary: 49 year old with past medical history significant for hypertension, morbid obesity, GERD she said with sudden onset of left-sided flank pain accompanied with nausea, vomiting and fever.  Symptoms started 3 days prior to admission.  She was evaluated in the ED the day prior to admission CT showed obstructing 4 mm left UVJ stone on with mild left hydronephrosis.  Urology was contacted curbside recommended oral antibiotics and follow-up as an outpatient.  Patient went home but developed high fever, nausea vomiting unable to keep antibiotics presented to the hospital for further treatment.  Patient admitted with sepsis secondary to obstructing 4 mm left UVJ stone pyelonephritis.  She underwent cystoscopy and left ureteral stent placement on 03/03/2020   1-Sepsis secondary to pyelonephritis secondary to left side obstructive uropathy left UV J stone; Patient received 30 cc/kg IV boluses. Urine culture from 6/3 growing E. Coli  Blood cultures growing Proteus,  Received  IV ceftriaxone for 4 days. Lactic acid and leukocytosis trending.  Plan  to discharge on Bactrim per pharmacist MIC for cefazolin is high and keflex might not work well.   2-Proteous Bacteremia;  Treated  with ceftriaxone.  Repeat Blood cultures; no growth to date.  Plan  to discharge on Cipro for 10 days.   Left-sided obstructive uropathy secondary to left UVJ stone: Underwent cystoscopy  and left ureteral stent placement on 6//2021 -Continue with supportive care. -Urology recommended follow-up in 2 to 3 weeks to have stent removal and final treatment for stone.   AKI with anion gap metabolic acidosis Secondary to obstructive uropathy and sepsis Improved with IV fluids  Hypertension: Hold losartan/hydrochlorothiazide due to AKI and sepsis Resume bystolic.   Anemia;  anemia panel. Consistent with iron deficiency.   Acute hypoxic respiratory failure; Acute pulmonary edema for fluid resuscitation  Chest x-ray with bilateral pulmonary edema NSL Repeat lasix prior to discharge.   Discharge Diagnoses:  Active Problems:   Streptococcal sepsis, unspecified (HCC)   Sepsis [facility])    Discharge Instructions  Discharge Instructions    Diet - low sodium heart healthy   Complete by: As directed    Increase activity slowly   Complete by: As directed      Allergies as of 03/07/2020   No Known Allergies     Medication List    STOP taking these medications   cephALEXin 500 MG capsule Commonly known as: KEFLEX   pantoprazole 20 MG tablet Commonly known as: PROTONIX   vitamin B-12 500 MCG tablet Commonly known as: CYANOCOBALAMIN     TAKE these medications   aspirin EC 81 MG tablet Take 81 mg by mouth daily.   bisoprolol 10 MG tablet Commonly known as: ZEBETA Take 10 mg by mouth daily.   ciprofloxacin 500 MG tablet Commonly known as: Cipro Take 1 tablet (500 mg total) by mouth 2 (two) times daily for 10 days.   clonazePAM 2 MG tablet Commonly known as: KLONOPIN Take 2 mg by mouth 2 (two) times daily.   ferrous sulfate  325 (65 FE) MG tablet Take 1 tablet (325 mg total) by mouth daily with breakfast.   guaiFENesin-dextromethorphan 100-10 MG/5ML syrup Commonly known as: ROBITUSSIN DM Take 5 mLs by mouth every 4 (four) hours as needed for cough.   losartan-hydrochlorothiazide 100-25 MG tablet Commonly known as: HYZAAR Take 1 tablet by mouth daily.    omeprazole 20 MG capsule Commonly known as: PRILOSEC Take 20 mg by mouth daily.   ondansetron 4 MG disintegrating tablet Commonly known as: Zofran ODT Take 1 tablet (4 mg total) by mouth every 8 (eight) hours as needed for nausea or vomiting.   OVER THE COUNTER MEDICATION Take 1 tablet by mouth daily. Enantyum Plus   oxyCODONE-acetaminophen 5-325 MG tablet Commonly known as: PERCOCET/ROXICET Take 2 tablets by mouth every 4 (four) hours as needed for severe pain.   tamsulosin 0.4 MG Caps capsule Commonly known as: FLOMAX Take 1 capsule (0.4 mg total) by mouth daily.      Follow-up Information    [facility].   Why: Please contact the office when you are discharged from the hospital Contact information: [address] [address] [place] [address] [phone]         No Known Allergies  Consultations: Urology   Procedures/Studies: CT ABDOMEN PELVIS W CONTRAST  Result Date: 03/02/2020 CLINICAL DATA:  Right lower quadrant abdominal pain radiating to the back for few days. EXAM: CT ABDOMEN AND PELVIS WITH CONTRAST TECHNIQUE: Multidetector CT imaging of the abdomen and pelvis was performed using the standard protocol following bolus administration of intravenous contrast. CONTRAST:  OMNIPAQUE IOHEXOL 300 MG/ML  SOLN COMPARISON:  None. FINDINGS: Lower chest: No significant pulmonary nodules or acute consolidative airspace disease. Hepatobiliary: Normal liver size. No liver mass. Normal gallbladder with no radiopaque cholelithiasis. No biliary ductal dilatation. Pancreas: Normal, with no mass or duct dilation. Spleen: Normal size. No mass. Adrenals/Urinary Tract: Normal adrenals. Asymmetrically delayed left contrast nephrogram with left perinephric fat stranding and ill-defined fluid. Obstructing 4 mm left ureterovesical junction stone with mild left hydroureteronephrosis. No renal masses. No right hydronephrosis. Normal bladder. Stomach/Bowel: Normal  non-distended stomach. Normal caliber small bowel with no small bowel wall thickening. Normal appendix. Normal large bowel with no diverticulosis, large bowel wall thickening or pericolonic fat stranding. Vascular/Lymphatic: Normal caliber abdominal aorta. Patent portal, splenic, hepatic and renal veins. No pathologically enlarged lymph nodes in the abdomen or pelvis. Reproductive: Mildly enlarged and mildly heterogeneous uterus. Simple 1.2 cm left adnexal cyst (series 3/image 74), requiring no follow-up. No right adnexal mass. Other: No pneumoperitoneum, ascites or focal fluid collection. Musculoskeletal: No aggressive appearing focal osseous lesions. Minimal thoracolumbar spondylosis. IMPRESSION: Obstructing 4 mm left UVJ stone with mild left hydroureteronephrosis. Electronically Signed   By: Delbert Phenix M.D.   On: 03/02/2020 16:02   DG Cystogram  Result Date: 03/06/2020 CLINICAL DATA:  Fluoroscopy provided for cystoscopy and stent placement. EXAM: CYSTOGRAM - 3+ VIEW COMPARISON:  CT, 03/02/2020 FINDINGS: Single submitted image shows a stent extending through the left ureter. IMPRESSION: Portable fluoroscopic imaging provided for cystoscopy and stent placement Electronically Signed   By: Amie Portland M.D.   On: 03/06/2020 08:06   DG CHEST PORT 1 VIEW  Result Date: 03/04/2020 CLINICAL DATA:  Shortness of breath, abdominal pain EXAM: PORTABLE CHEST 1 VIEW COMPARISON:  03/03/2020 FINDINGS: Mild cardiomegaly. New diffuse bilateral interstitial opacity. The visualized skeletal structures are unremarkable. IMPRESSION: Mild cardiomegaly with new diffuse bilateral interstitial pulmonary opacity, consistent with edema. No focal airspace opacity.  Electronically Signed   By: Lauralyn Primes M.D.   On: 03/04/2020 11:35   DG Chest Port 1 View  Result Date: 03/03/2020 CLINICAL DATA:  Fever EXAM: PORTABLE CHEST 1 VIEW COMPARISON:  None. FINDINGS: Low lung volumes. No consolidation or edema. No pleural effusion.  Cardiomediastinal contours are likely within normal limits for technique. IMPRESSION: No acute process in the chest Electronically Signed   By: Guadlupe Spanish M.D.   On: 03/03/2020 12:19     Subjective: Feeling well, mild cough  Discharge Exam: Vitals:   03/07/20 0434 03/07/20 0803  BP: (!) 144/85 124/88  Pulse: 94 85  Resp: 20 (!) 24  Temp: 99.7 F (37.6 C) 98.7 F (37.1 C)  SpO2: 95% 97%     General: Pt is alert, awake, not in acute distress Cardiovascular: RRR, S1/S2 +, no rubs, no gallops Respiratory: CTA bilaterally, no wheezing, no rhonchi Abdominal: Soft, NT, ND, bowel sounds + Extremities: no edema, no cyanosis    The results of significant diagnostics from this hospitalization (including imaging, microbiology, ancillary and laboratory) are listed below for reference.     Microbiology: Recent Results (from the past 240 hour(s))  Urine culture     Status: Abnormal   Collection Time: 03/02/20 10:03 AM   Specimen: Urine, Catheterized  Result Value Ref Range Status   Specimen Description URINE, CATHETERIZED  Final   Special Requests NONE  Final   Culture (A)  Final    40,000 COLONIES/mL ESCHERICHIA COLI 20,000 COLONIES/mL GROUP B STREP(S.AGALACTIAE)ISOLATED TESTING AGAINST S. AGALACTIAE NOT ROUTINELY PERFORMED DUE TO PREDICTABILITY OF AMP/PEN/VAN SUSCEPTIBILITY. Performed at [facility], [address] [place], [place] [address]    Report Status 03/04/2020 FINAL  Final   Organism ID, Bacteria ESCHERICHIA COLI (A)  Final      Susceptibility   Escherichia coli - MIC*    AMPICILLIN >=32 RESISTANT Resistant     CEFAZOLIN <=4 SENSITIVE Sensitive     CEFTRIAXONE <=1 SENSITIVE Sensitive     CIPROFLOXACIN <=0.25 SENSITIVE Sensitive     GENTAMICIN <=1 SENSITIVE Sensitive     IMIPENEM <=0.25 SENSITIVE Sensitive     NITROFURANTOIN 64 INTERMEDIATE Intermediate     TRIMETH/SULFA >=320 RESISTANT Resistant     AMPICILLIN/SULBACTAM 8 SENSITIVE Sensitive      PIP/TAZO <=4 SENSITIVE Sensitive     * 40,000 COLONIES/mL ESCHERICHIA COLI  Culture, blood (routine x 2)     Status: Abnormal   Collection Time: 03/03/20 11:42 AM   Specimen: BLOOD LEFT FOREARM  Result Value Ref Range Status   Specimen Description BLOOD LEFT FOREARM  Final   Special Requests   Final    BOTTLES DRAWN AEROBIC AND ANAEROBIC Blood Culture adequate volume   Culture  Setup Time   Final    IN BOTH AEROBIC AND ANAEROBIC BOTTLES GRAM NEGATIVE RODS CRITICAL RESULT CALLED TO, READ BACK BY AND VERIFIED WITH: Lytle Butte [facility] 03/04/20 0103 JDW Performed at [facility], [address], [place], [place] [address]    Culture PROTEUS MIRABILIS (A)  Final   Report Status 03/05/2020 FINAL  Final   Organism ID, Bacteria PROTEUS MIRABILIS  Final      Susceptibility   Proteus mirabilis - MIC*    AMPICILLIN >=32 RESISTANT Resistant     CEFAZOLIN 8 SENSITIVE Sensitive     CEFEPIME <=1 SENSITIVE Sensitive     CEFTAZIDIME <=1 SENSITIVE Sensitive     CEFTRIAXONE <=1 SENSITIVE Sensitive     CIPROFLOXACIN <=0.25 SENSITIVE Sensitive  GENTAMICIN <=1 SENSITIVE Sensitive     IMIPENEM 2 SENSITIVE Sensitive     TRIMETH/SULFA <=20 SENSITIVE Sensitive     AMPICILLIN/SULBACTAM 16 INTERMEDIATE Intermediate     PIP/TAZO <=4 SENSITIVE Sensitive     * PROTEUS MIRABILIS  Blood Culture ID Panel (Reflexed)     Status: Abnormal   Collection Time: 03/03/20 11:42 AM  Result Value Ref Range Status   Enterococcus species NOT DETECTED NOT DETECTED Final   Listeria monocytogenes NOT DETECTED NOT DETECTED Final   Staphylococcus species NOT DETECTED NOT DETECTED Final   Staphylococcus aureus (BCID) NOT DETECTED NOT DETECTED Final   Streptococcus species NOT DETECTED NOT DETECTED Final   Streptococcus agalactiae NOT DETECTED NOT DETECTED Final   Streptococcus pneumoniae NOT DETECTED NOT DETECTED Final   Streptococcus pyogenes NOT DETECTED NOT DETECTED Final   Acinetobacter baumannii NOT DETECTED NOT  DETECTED Final   Enterobacteriaceae species DETECTED (A) NOT DETECTED Final    Comment: Enterobacteriaceae represent a large family of gram-negative bacteria, not a single organism. CRITICAL RESULT CALLED TO, READ BACK BY AND VERIFIED WITH: Lytle ButteM MACCIA PHARMD 03/04/20 0103 JDW    Enterobacter cloacae complex NOT DETECTED NOT DETECTED Final   Escherichia coli NOT DETECTED NOT DETECTED Final   Klebsiella oxytoca NOT DETECTED NOT DETECTED Final   Klebsiella pneumoniae NOT DETECTED NOT DETECTED Final   Proteus species DETECTED (A) NOT DETECTED Final    Comment: CRITICAL RESULT CALLED TO, READ BACK BY AND VERIFIED WITH: M MACCIA PHARMD 03/04/20 0103 JDW    Serratia marcescens NOT DETECTED NOT DETECTED Final   Carbapenem resistance NOT DETECTED NOT DETECTED Final   Haemophilus influenzae NOT DETECTED NOT DETECTED Final   Neisseria meningitidis NOT DETECTED NOT DETECTED Final   Pseudomonas aeruginosa NOT DETECTED NOT DETECTED Final   Candida albicans NOT DETECTED NOT DETECTED Final   Candida glabrata NOT DETECTED NOT DETECTED Final   Candida krusei NOT DETECTED NOT DETECTED Final   Candida parapsilosis NOT DETECTED NOT DETECTED Final   Candida tropicalis NOT DETECTED NOT DETECTED Final    Comment: Performed at [facility], [address] [place], [place] [address]  SARS Coronavirus 2 by RT PCR (hospital order, performed in [facility] hospital lab) Nasopharyngeal Nasopharyngeal Swab     Status: None   Collection Time: 03/03/20 12:06 PM   Specimen: Nasopharyngeal Swab  Result Value Ref Range Status   SARS Coronavirus 2 NEGATIVE NEGATIVE Final    Comment: (NOTE) SARS-CoV-2 target nucleic acids are NOT DETECTED. The SARS-CoV-2 RNA is generally detectable in upper and lower respiratory specimens during the acute phase of infection. The lowest concentration of SARS-CoV-2 viral copies this assay can detect is 250 copies / mL. A negative result does not preclude SARS-CoV-2 infection and should  not be used as the sole basis for treatment or other patient management decisions.  A negative result may occur with improper specimen collection / handling, submission of specimen other than nasopharyngeal swab, presence of viral mutation(s) within the areas targeted by this assay, and inadequate number of viral copies (<250 copies / mL). A negative result must be combined with clinical observations, patient history, and epidemiological information. Fact Sheet for Patients:   BoilerBrush.com.cyhttps://www.fda.gov/media/136312/download Fact Sheet for Healthcare Providers: https://pope.com/https://www.fda.gov/media/136313/download This test is not yet approved or cleared  by the [place] FDA and has been authorized for detection and/or diagnosis of SARS-CoV-2 by FDA under an Emergency Use Authorization (EUA).  This EUA will remain in effect (meaning this test can be used)  for the duration of the COVID-19 declaration under Section 564(b)(1) of the Act, 21 U.S.C. section 360bbb-3(b)(1), unless the authorization is terminated or revoked sooner. Performed at [facility], [address], [place], [place] [address]   Culture, blood (routine x 2)     Status: None (Preliminary result)   Collection Time: 03/03/20 12:45 PM   Specimen: BLOOD RIGHT WRIST  Result Value Ref Range Status   Specimen Description BLOOD RIGHT WRIST  Final   Special Requests   Final    BOTTLES DRAWN AEROBIC AND ANAEROBIC Blood Culture results may not be optimal due to an inadequate volume of blood received in culture bottles   Culture   Final    NO GROWTH 4 DAYS Performed at [facility], [address], [place], [place] [address]    Report Status PENDING  Incomplete  Culture, blood (routine x 2)     Status: None (Preliminary result)   Collection Time: 03/06/20 12:35 AM   Specimen: BLOOD RIGHT ARM  Result Value Ref Range Status   Specimen Description BLOOD RIGHT ARM  Final   Special Requests   Final    BOTTLES DRAWN AEROBIC AND  ANAEROBIC Blood Culture adequate volume   Culture   Final    NO GROWTH 1 DAY Performed at [facility], [address] [place], [place] [address]    Report Status PENDING  Incomplete  Culture, blood (routine x 2)     Status: None (Preliminary result)   Collection Time: 03/06/20 12:44 AM   Specimen: BLOOD RIGHT HAND  Result Value Ref Range Status   Specimen Description BLOOD RIGHT HAND  Final   Special Requests   Final    BOTTLES DRAWN AEROBIC AND ANAEROBIC Blood Culture adequate volume   Culture   Final    NO GROWTH 1 DAY Performed at [facility], [address] [place], [place] [address]    Report Status PENDING  Incomplete     Labs: BNP (last 3 results) No results for input(s): BNP in the last 8760 hours. Basic Metabolic Panel: Recent Labs  Lab 03/02/20 1017 03/03/20 1142 03/04/20 0350 03/05/20 1010 03/07/20 0330  NA 137 135 143 139 140  K 3.4* 2.9* 3.5 3.2* 3.8  CL 100 98 109 108 109  CO2 23 18* GLUCOSE 133* 126* 171* 124* 102*  BUN 10 24* CREATININE 0.75 2.28* 0.91 0.63 0.58  CALCIUM 8.9 8.2* 7.7* 7.7* 8.6*   Liver Function Tests: Recent Labs  Lab 03/02/20 1017  AST 26  ALT 16  ALKPHOS 68  BILITOT 0.6  PROT 7.6  ALBUMIN 3.6   Recent Labs  Lab 03/02/20 1017  LIPASE 20   No results for input(s): AMMONIA in the last 168 hours. CBC: Recent Labs  Lab 03/02/20 1017 03/03/20 1142 03/04/20 0350 03/05/20 1010  WBC 10.7* 12.2* 18.0* 10.2  NEUTROABS  --  10.5*  --   --   HGB 11.2* 11.1* 9.2* 8.4*  HCT 35.5* 35.9* 28.8* 25.8*  MCV 80.9 81.8 81.1 79.4*  PLT 313 168 109* 175   Cardiac Enzymes: No results for input(s): CKTOTAL, CKMB, CKMBINDEX, TROPONINI in the last 168 hours. BNP: Invalid input(s): POCBNP CBG: No results for input(s): GLUCAP in the last 168 hours. D-Dimer No results for input(s): DDIMER in the last 72 hours. Hgb A1c No results for input(s): HGBA1C in the last 72 hours. Lipid Profile No results  for input(s):  CHOL, HDL, LDLCALC, TRIG, CHOLHDL, LDLDIRECT in the last 72 hours. Thyroid function studies No results for input(s): TSH, T4TOTAL, T3FREE, THYROIDAB in the last 72 hours.  Invalid input(s): FREET3 Anemia work up Recent Labs    03/06/20 0035  VITAMINB12 4,301*  FOLATE 22.5  FERRITIN 48  TIBC 319  IRON 17*  RETICCTPCT 0.7   Urinalysis    Component Value Date/Time   COLORURINE PINK 03/02/2020 1003   APPEARANCEUR TURBID (A) 03/02/2020 1003   LABSPEC 1.015 03/02/2020 1003   PHURINE 8.0 03/02/2020 1003   GLUCOSEU NEGATIVE 03/02/2020 1003   HGBUR LARGE (A) 03/02/2020 1003   BILIRUBINUR NEGATIVE 03/02/2020 1003   KETONESUR NEGATIVE 03/02/2020 1003   PROTEINUR 30 (A) 03/02/2020 1003   NITRITE NEGATIVE 03/02/2020 1003   LEUKOCYTESUR MODERATE (A) 03/02/2020 1003   Sepsis Labs Invalid input(s): PROCALCITONIN,  WBC,  LACTICIDVEN Microbiology Recent Results (from the past 240 hour(s))  Urine culture     Status: Abnormal   Collection Time: 03/02/20 10:03 AM   Specimen: Urine, Catheterized  Result Value Ref Range Status   Specimen Description URINE, CATHETERIZED  Final   Special Requests NONE  Final   Culture (A)  Final    40,000 COLONIES/mL ESCHERICHIA COLI 20,000 COLONIES/mL GROUP B STREP(S.AGALACTIAE)ISOLATED TESTING AGAINST S. AGALACTIAE NOT ROUTINELY PERFORMED DUE TO PREDICTABILITY OF AMP/PEN/VAN SUSCEPTIBILITY. Performed at [facility], [place] [address] [place], [place] [address]    Report Status 03/04/2020 FINAL  Final   Organism ID, Bacteria ESCHERICHIA COLI (A)  Final      Susceptibility   Escherichia coli - MIC*    AMPICILLIN >=32 RESISTANT Resistant     CEFAZOLIN <=4 SENSITIVE Sensitive     CEFTRIAXONE <=1 SENSITIVE Sensitive     CIPROFLOXACIN <=0.25 SENSITIVE Sensitive     GENTAMICIN <=1 SENSITIVE Sensitive     IMIPENEM <=0.25 SENSITIVE Sensitive     NITROFURANTOIN 64 INTERMEDIATE Intermediate     TRIMETH/SULFA >=320 RESISTANT Resistant      AMPICILLIN/SULBACTAM 8 SENSITIVE Sensitive     PIP/TAZO <=4 SENSITIVE Sensitive     * 40,000 COLONIES/mL ESCHERICHIA COLI  Culture, blood (routine x 2)     Status: Abnormal   Collection Time: 03/03/20 11:42 AM   Specimen: BLOOD LEFT FOREARM  Result Value Ref Range Status   Specimen Description BLOOD LEFT FOREARM  Final   Special Requests   Final    BOTTLES DRAWN AEROBIC AND ANAEROBIC Blood Culture adequate volume   Culture  Setup Time   Final    IN BOTH AEROBIC AND ANAEROBIC BOTTLES GRAM NEGATIVE RODS CRITICAL RESULT CALLED TO, READ BACK BY AND VERIFIED WITH: Hughie [facility] 03/04/20 0103 JDW Performed at [facility], [address], [place], [place] [address]    Culture PROTEUS MIRABILIS (A)  Final   Report Status 03/05/2020 FINAL  Final   Organism ID, Bacteria PROTEUS MIRABILIS  Final      Susceptibility   Proteus mirabilis - MIC*    AMPICILLIN >=32 RESISTANT Resistant     CEFAZOLIN 8 SENSITIVE Sensitive     CEFEPIME <=1 SENSITIVE Sensitive     CEFTAZIDIME <=1 SENSITIVE Sensitive     CEFTRIAXONE <=1 SENSITIVE Sensitive     CIPROFLOXACIN <=0.25 SENSITIVE Sensitive     GENTAMICIN <=1 SENSITIVE Sensitive     IMIPENEM 2 SENSITIVE Sensitive     TRIMETH/SULFA <=20 SENSITIVE Sensitive     AMPICILLIN/SULBACTAM 16 INTERMEDIATE Intermediate     PIP/TAZO <=4 SENSITIVE Sensitive     *  PROTEUS MIRABILIS  Blood Culture ID Panel (Reflexed)     Status: Abnormal   Collection Time: 03/03/20 11:42 AM  Result Value Ref Range Status   Enterococcus species NOT DETECTED NOT DETECTED Final   Listeria monocytogenes NOT DETECTED NOT DETECTED Final   Staphylococcus species NOT DETECTED NOT DETECTED Final   Staphylococcus aureus (BCID) NOT DETECTED NOT DETECTED Final   Streptococcus species NOT DETECTED NOT DETECTED Final   Streptococcus agalactiae NOT DETECTED NOT DETECTED Final   Streptococcus pneumoniae NOT DETECTED NOT DETECTED Final   Streptococcus pyogenes NOT DETECTED NOT DETECTED Final    Acinetobacter baumannii NOT DETECTED NOT DETECTED Final   Enterobacteriaceae species DETECTED (A) NOT DETECTED Final    Comment: Enterobacteriaceae represent a large family of gram-negative bacteria, not a single organism. CRITICAL RESULT CALLED TO, READ BACK BY AND VERIFIED WITH: Lytle Butte PHARMD 03/04/20 0103 JDW    Enterobacter cloacae complex NOT DETECTED NOT DETECTED Final   Escherichia coli NOT DETECTED NOT DETECTED Final   Klebsiella oxytoca NOT DETECTED NOT DETECTED Final   Klebsiella pneumoniae NOT DETECTED NOT DETECTED Final   Proteus species DETECTED (A) NOT DETECTED Final    Comment: CRITICAL RESULT CALLED TO, READ BACK BY AND VERIFIED WITH: M MACCIA PHARMD 03/04/20 0103 JDW    Serratia marcescens NOT DETECTED NOT DETECTED Final   Carbapenem resistance NOT DETECTED NOT DETECTED Final   Haemophilus influenzae NOT DETECTED NOT DETECTED Final   Neisseria meningitidis NOT DETECTED NOT DETECTED Final   Pseudomonas aeruginosa NOT DETECTED NOT DETECTED Final   Candida albicans NOT DETECTED NOT DETECTED Final   Candida glabrata NOT DETECTED NOT DETECTED Final   Candida krusei NOT DETECTED NOT DETECTED Final   Candida parapsilosis NOT DETECTED NOT DETECTED Final   Candida tropicalis NOT DETECTED NOT DETECTED Final    Comment: Performed at [facility], [address] [place], [place] [address]  SARS Coronavirus 2 by RT PCR (hospital order, performed in [facility] hospital lab) Nasopharyngeal Nasopharyngeal Swab     Status: None   Collection Time: 03/03/20 12:06 PM   Specimen: Nasopharyngeal Swab  Result Value Ref Range Status   SARS Coronavirus 2 NEGATIVE NEGATIVE Final    Comment: (NOTE) SARS-CoV-2 target nucleic acids are NOT DETECTED. The SARS-CoV-2 RNA is generally detectable in upper and lower respiratory specimens during the acute phase of infection. The lowest concentration of SARS-CoV-2 viral copies this assay can detect is 250 copies / mL. A negative result does  not preclude SARS-CoV-2 infection and should not be used as the sole basis for treatment or other patient management decisions.  A negative result may occur with improper specimen collection / handling, submission of specimen other than nasopharyngeal swab, presence of viral mutation(s) within the areas targeted by this assay, and inadequate number of viral copies (<250 copies / mL). A negative result must be combined with clinical observations, patient history, and epidemiological information. Fact Sheet for Patients:   BoilerBrush.com.cy Fact Sheet for Healthcare Providers: https://pope.com/ This test is not yet approved or cleared  by the [place] FDA and has been authorized for detection and/or diagnosis of SARS-CoV-2 by FDA under an Emergency Use Authorization (EUA).  This EUA will remain in effect (meaning this test can be used) for the duration of the COVID-19 declaration under Section 564(b)(1) of the Act, 21 U.S.C. section 360bbb-3(b)(1), unless the authorization is terminated or revoked sooner. Performed at [facility], [address], [place], [place] [address]   Culture,  blood (routine x 2)     Status: None (Preliminary result)   Collection Time: 03/03/20 12:45 PM   Specimen: BLOOD RIGHT WRIST  Result Value Ref Range Status   Specimen Description BLOOD RIGHT WRIST  Final   Special Requests   Final    BOTTLES DRAWN AEROBIC AND ANAEROBIC Blood Culture results may not be optimal due to an inadequate volume of blood received in culture bottles   Culture   Final    NO GROWTH 4 DAYS Performed at [facility], [address], [place], [place] [address]    Report Status PENDING  Incomplete  Culture, blood (routine x 2)     Status: None (Preliminary result)   Collection Time: 03/06/20 12:35 AM   Specimen: BLOOD RIGHT ARM  Result Value Ref Range Status   Specimen Description BLOOD RIGHT ARM  Final   Special  Requests   Final    BOTTLES DRAWN AEROBIC AND ANAEROBIC Blood Culture adequate volume   Culture   Final    NO GROWTH 1 DAY Performed at [facility], [address], [place], [place] [address]    Report Status PENDING  Incomplete  Culture, blood (routine x 2)     Status: None (Preliminary result)   Collection Time: 03/06/20 12:44 AM   Specimen: BLOOD RIGHT HAND  Result Value Ref Range Status   Specimen Description BLOOD RIGHT HAND  Final   Special Requests   Final    BOTTLES DRAWN AEROBIC AND ANAEROBIC Blood Culture adequate volume   Culture   Final    NO GROWTH 1 DAY Performed at [facility], [address] [place], [place] [address]    Report Status PENDING  Incomplete     Time coordinating discharge: 40 minutes  SIGNED:   Alba Cory, MD  [facility]

## 2020-03-07 NOTE — Progress Notes (Signed)
Pt discharged from unit. Medication/discharge instruction given, VVS  Trask Vosler K Carlisle Enke, RN   

## 2020-03-08 LAB — CULTURE, BLOOD (ROUTINE X 2): Culture: NO GROWTH

## 2020-03-11 LAB — CULTURE, BLOOD (ROUTINE X 2)
Culture: NO GROWTH
Culture: NO GROWTH
Special Requests: ADEQUATE
Special Requests: ADEQUATE

## 2020-03-13 ENCOUNTER — Encounter (HOSPITAL_COMMUNITY)
Admission: RE | Admit: 2020-03-13 | Discharge: 2020-03-13 | Disposition: A | Discharge status: Home / Self Care | Payer: Self-pay | Source: Ambulatory Visit | Attending: Urology | Admitting: Urology

## 2020-03-13 ENCOUNTER — Encounter (HOSPITAL_COMMUNITY): Payer: Self-pay

## 2020-03-13 ENCOUNTER — Other Ambulatory Visit: Payer: Self-pay

## 2020-03-13 DIAGNOSIS — Z01812 Encounter for preprocedural laboratory examination: Secondary | ICD-10-CM | POA: Insufficient documentation

## 2020-03-13 HISTORY — DX: Palpitations: R00.2

## 2020-03-13 HISTORY — DX: Anxiety disorder, unspecified: F41.9

## 2020-03-13 HISTORY — DX: Prediabetes: R73.03

## 2020-03-13 HISTORY — DX: Anemia, unspecified: D64.9

## 2020-03-13 HISTORY — DX: Pneumonia, unspecified organism: J18.9

## 2020-03-13 LAB — BASIC METABOLIC PANEL
Anion gap: 12 (ref 5–15)
BUN: 16 mg/dL (ref 6–20)
CO2: 20 mmol/L — ABNORMAL LOW (ref 22–32)
Calcium: 8.9 mg/dL (ref 8.9–10.3)
Chloride: 108 mmol/L (ref 98–111)
Creatinine, Ser: 0.61 mg/dL (ref 0.44–1.00)
GFR calc Af Amer: >60 mL/min (ref >60)
GFR calc non Af Amer: >60 mL/min (ref >60)
Glucose, Bld: 155 mg/dL — ABNORMAL HIGH (ref 70–99)
Potassium: 3.9 mmol/L (ref 3.5–5.1)
Sodium: 140 mmol/L (ref 135–145)

## 2020-03-13 LAB — HEMOGLOBIN A1C
Hgb A1c MFr Bld: 6.4 % — ABNORMAL HIGH (ref 4.8–5.6)
Mean Plasma Glucose: 136.98 mg/dL

## 2020-03-13 LAB — CBC
HCT: 33 % — ABNORMAL LOW (ref 36.0–46.0)
Hemoglobin: 10.2 g/dL — ABNORMAL LOW (ref 12.0–15.0)
MCH: 25.5 pg — ABNORMAL LOW (ref 26.0–34.0)
MCHC: 30.9 g/dL (ref 30.0–36.0)
MCV: 82.5 fL (ref 80.0–100.0)
Platelets: 379 10*3/uL (ref 150–400)
RBC: 4 MIL/uL (ref 3.87–5.11)
RDW: 17.2 % — ABNORMAL HIGH (ref 11.5–15.5)
WBC: 7.4 10*3/uL (ref 4.0–10.5)
nRBC: 0 % (ref 0.0–0.2)

## 2020-03-13 NOTE — Patient Instructions (Addendum)
DUE TO COVID-19 ONLY ONE VISITOR IS ALLOWED TO COME WITH YOU AND STAY IN THE WAITING ROOM ONLY DURING PRE OP AND PROCEDURE DAY OF SURGERY. THE 1 VISITOR MAY VISIT WITH YOU AFTER SURGERY IN YOUR PRIVATE ROOM DURING VISITING HOURS ONLY!  YOU NEED TO HAVE A COVID 19 TEST ON: 03/17/20 @ 2:00 pm, THIS TEST MUST BE DONE BEFORE SURGERY, COME  [address], [place] , [address].  [facility]) ONCE YOUR COVID TEST IS COMPLETED, PLEASE BEGIN THE QUARANTINE INSTRUCTIONS AS OUTLINED IN YOUR HANDOUT.                Meghan [name]   Your procedure is scheduled on: 03/21/20   Report to [facility] Main  Entrance   Report to short stay at: 5:30 AM     Call this number if you have problems the morning of surgery [phone]    Remember: Do not eat food or drink liquids :After Midnight.   BRUSH YOUR TEETH MORNING OF SURGERY AND RINSE YOUR MOUTH OUT, NO CHEWING GUM CANDY OR MINTS.     Take these medicines the morning of surgery with A SIP OF WATER: amlodipine                                 You may not have any metal on your body including hair pins and              piercings  Do not wear jewelry, make-up, lotions, powders or perfumes, deodorant             Do not wear nail polish on your fingernails.  Do not shave  48 hours prior to surgery.             Do not bring valuables to the hospital. [facility] IS NOT             RESPONSIBLE   FOR VALUABLES.  Contacts, dentures or bridgework may not be worn into surgery.  Leave suitcase in the car. After surgery it may be brought to your room.     Patients discharged the day of surgery will not be allowed to drive home. IF YOU ARE HAVING SURGERY AND GOING HOME THE SAME DAY, YOU MUST HAVE AN ADULT TO DRIVE YOU HOME AND BE WITH YOU FOR 24 HOURS. YOU MAY GO HOME BY TAXI OR UBER OR ORTHERWISE, BUT AN ADULT MUST ACCOMPANY YOU HOME AND STAY WITH YOU FOR 24 HOURS.  Name and phone number of your driver:  Special Instructions:  N/A              Please read over the following fact sheets you were given: _____________________________________________________________________  [facility] - Preparing for Surgery Before surgery, you can play an important role.  Because skin is not sterile, your skin needs to be as free of germs as possible.  You can reduce the number of germs on your skin by washing with CHG (chlorahexidine gluconate) soap before surgery.  CHG is an antiseptic cleaner which kills germs and bonds with the skin to continue killing germs even after washing. Please DO NOT use if you have an allergy to CHG or antibacterial soaps.  If your skin becomes reddened/irritated stop using the CHG and inform your nurse when you arrive at Short Stay. Do not shave (including legs and underarms) for at least 48 hours prior to the first CHG shower.  You may shave your face/neck. Please follow these instructions carefully:  1.  Shower with CHG Soap the night before surgery and the  morning of Surgery.  2.  If you choose to wash your hair, wash your hair first as usual with your  normal  shampoo.  3.  After you shampoo, rinse your hair and body thoroughly to remove the  shampoo.                           4.  Use CHG as you would any other liquid soap.  You can apply chg directly  to the skin and wash                       Gently with a scrungie or clean washcloth.  5.  Apply the CHG Soap to your body ONLY FROM THE NECK DOWN.   Do not use on face/ open                           Wound or open sores. Avoid contact with eyes, ears mouth and genitals (private parts).                       Wash face,  Genitals (private parts) with your normal soap.             6.  Wash thoroughly, paying special attention to the area where your surgery  will be performed.  7.  Thoroughly rinse your body with warm water from the neck down.  8.  DO NOT shower/wash with your normal soap after using and rinsing off  the CHG Soap.                9.  Pat  yourself dry with a clean towel.            10.  Wear clean pajamas.            11.  Place clean sheets on your bed the night of your first shower and do not  sleep with pets. Day of Surgery : Do not apply any lotions/deodorants the morning of surgery.  Please wear clean clothes to the hospital/surgery center.  FAILURE TO FOLLOW THESE INSTRUCTIONS MAY RESULT IN THE CANCELLATION OF YOUR SURGERY PATIENT SIGNATURE_________________________________  NURSE SIGNATURE__________________________________  ________________________________________________________________________

## 2020-03-13 NOTE — Progress Notes (Signed)
COVID Vaccine Completed: yes Date COVID Vaccine completed: COVID vaccine manufacturer: Pfizer    Moderna   [employer]   PCP - No PCP Cardiologist -   Chest x-ray - 03/04/20 EKG - 03/03/20 Stress Test -  ECHO -  Cardiac Cath -   Sleep Study -  CPAP -   Fasting Blood Sugar -  Checks Blood Sugar _____ times a day  Blood Thinner Instructions: Aspirin Instructions: Last Dose:  Anesthesia review:   Patient denies shortness of breath, fever, cough and chest pain at PAT appointment   Patient verbalized understanding of instructions that were given to them at the PAT appointment. Patient was also instructed that they will need to review over the PAT instructions again at home before surgery.

## 2020-03-17 ENCOUNTER — Other Ambulatory Visit (HOSPITAL_COMMUNITY)
Admission: RE | Admit: 2020-03-17 | Discharge: 2020-03-17 | Disposition: A | Discharge status: Home / Self Care | Payer: Self-pay | Source: Ambulatory Visit | Attending: Urology | Admitting: Urology

## 2020-03-17 DIAGNOSIS — Z20822 Contact with and (suspected) exposure to covid-19: Secondary | ICD-10-CM | POA: Insufficient documentation

## 2020-03-17 DIAGNOSIS — Z01812 Encounter for preprocedural laboratory examination: Secondary | ICD-10-CM | POA: Insufficient documentation

## 2020-03-18 LAB — SARS CORONAVIRUS 2 (TAT 6-24 HRS): SARS Coronavirus 2: NEGATIVE

## 2020-03-21 ENCOUNTER — Encounter (HOSPITAL_COMMUNITY): Payer: Self-pay | Admitting: Urology

## 2020-03-21 ENCOUNTER — Ambulatory Visit (HOSPITAL_COMMUNITY): Payer: Self-pay | Admitting: Certified Registered Nurse Anesthetist

## 2020-03-21 ENCOUNTER — Encounter (HOSPITAL_COMMUNITY)
Admission: RE | Disposition: A | Discharge status: Home / Self Care | Payer: Self-pay | Source: Other Acute Inpatient Hospital | Attending: Urology

## 2020-03-21 ENCOUNTER — Ambulatory Visit (HOSPITAL_COMMUNITY): Payer: Self-pay

## 2020-03-21 ENCOUNTER — Ambulatory Visit (HOSPITAL_COMMUNITY)
Admission: RE | Admit: 2020-03-21 | Discharge: 2020-03-21 | Disposition: A | Discharge status: Home / Self Care | Payer: Self-pay | Source: Other Acute Inpatient Hospital | Attending: Urology | Admitting: Urology

## 2020-03-21 DIAGNOSIS — N132 Hydronephrosis with renal and ureteral calculous obstruction: Secondary | ICD-10-CM | POA: Insufficient documentation

## 2020-03-21 DIAGNOSIS — I10 Essential (primary) hypertension: Secondary | ICD-10-CM | POA: Insufficient documentation

## 2020-03-21 DIAGNOSIS — E669 Obesity, unspecified: Secondary | ICD-10-CM | POA: Insufficient documentation

## 2020-03-21 DIAGNOSIS — Z7982 Long term (current) use of aspirin: Secondary | ICD-10-CM | POA: Insufficient documentation

## 2020-03-21 DIAGNOSIS — Z79899 Other long term (current) drug therapy: Secondary | ICD-10-CM | POA: Insufficient documentation

## 2020-03-21 DIAGNOSIS — Z6838 Body mass index (BMI) 38.0-38.9, adult: Secondary | ICD-10-CM | POA: Insufficient documentation

## 2020-03-21 HISTORY — DX: Anesthesia of skin: R20.0

## 2020-03-21 HISTORY — PX: CYSTOSCOPY WITH RETROGRADE PYELOGRAM, URETEROSCOPY AND STENT PLACEMENT: SHX5789

## 2020-03-21 LAB — PREGNANCY, URINE: Preg Test, Ur: NEGATIVE

## 2020-03-21 SURGERY — CYSTOURETEROSCOPY, WITH RETROGRADE PYELOGRAM AND STENT INSERTION
Anesthesia: General | Laterality: Left

## 2020-03-21 MED ORDER — MIDAZOLAM HCL 2 MG/2ML IJ SOLN
INTRAMUSCULAR | Status: DC | PRN
  Administered 2020-03-21: 2 mg via INTRAVENOUS

## 2020-03-21 MED ORDER — FENTANYL CITRATE (PF) 100 MCG/2ML IJ SOLN
INTRAMUSCULAR | Status: AC
  Filled 2020-03-21: qty 2

## 2020-03-21 MED ORDER — ORAL CARE MOUTH RINSE: 15.0000 mL | Freq: Once | OROMUCOSAL | Status: AC

## 2020-03-21 MED ORDER — OXYCODONE-ACETAMINOPHEN 5-325 MG PO TABS: 1.0000 | ORAL_TABLET | ORAL | 0 refills | Status: AC | PRN

## 2020-03-21 MED ORDER — DEXAMETHASONE SODIUM PHOSPHATE 10 MG/ML IJ SOLN
INTRAMUSCULAR | Status: AC
  Filled 2020-03-21: qty 1

## 2020-03-21 MED ORDER — ONDANSETRON HCL 4 MG/2ML IJ SOLN
INTRAMUSCULAR | Status: AC
  Filled 2020-03-21: qty 2

## 2020-03-21 MED ORDER — PHENAZOPYRIDINE HCL 100 MG PO TABS
100.0000 mg | ORAL_TABLET | Freq: Three times a day (TID) | ORAL | 0 refills | Status: AC | PRN
Start: 2020-03-21 — End: 2021-03-21

## 2020-03-21 MED ORDER — DEXAMETHASONE SODIUM PHOSPHATE 10 MG/ML IJ SOLN
INTRAMUSCULAR | Status: DC | PRN
  Administered 2020-03-21: 10 mg via INTRAVENOUS

## 2020-03-21 MED ORDER — CIPROFLOXACIN HCL 500 MG PO TABS
500.0000 mg | ORAL_TABLET | Freq: Once | ORAL | 0 refills | Status: AC
Start: 2020-03-21 — End: 2020-03-21

## 2020-03-21 MED ORDER — PROPOFOL 10 MG/ML IV BOLUS
INTRAVENOUS | Status: AC
  Filled 2020-03-21: qty 20

## 2020-03-21 MED ORDER — LACTATED RINGERS IV SOLN: INTRAVENOUS | Status: DC

## 2020-03-21 MED ORDER — MIDAZOLAM HCL 2 MG/2ML IJ SOLN
INTRAMUSCULAR | Status: AC
  Filled 2020-03-21: qty 2

## 2020-03-21 MED ORDER — FENTANYL CITRATE (PF) 100 MCG/2ML IJ SOLN
INTRAMUSCULAR | Status: DC | PRN
  Administered 2020-03-21 (×2): 50 ug via INTRAVENOUS

## 2020-03-21 MED ORDER — ONDANSETRON HCL 4 MG/2ML IJ SOLN
INTRAMUSCULAR | Status: DC | PRN
  Administered 2020-03-21: 4 mg via INTRAVENOUS

## 2020-03-21 MED ORDER — LIDOCAINE 2% (20 MG/ML) 5 ML SYRINGE
INTRAMUSCULAR | Status: AC
  Filled 2020-03-21: qty 5

## 2020-03-21 MED ORDER — SODIUM CHLORIDE 0.9 % IV SOLN
2.0000 g | INTRAVENOUS | Status: AC
  Administered 2020-03-21: 2 g via INTRAVENOUS
  Filled 2020-03-21: qty 20

## 2020-03-21 MED ORDER — LIDOCAINE 2% (20 MG/ML) 5 ML SYRINGE
INTRAMUSCULAR | Status: DC | PRN
  Administered 2020-03-21: 80 mg via INTRAVENOUS

## 2020-03-21 MED ORDER — SODIUM CHLORIDE 0.9 % IR SOLN
Status: DC | PRN
  Administered 2020-03-21: 6000 mL

## 2020-03-21 MED ORDER — SCOPOLAMINE 1 MG/3DAYS TD PT72
MEDICATED_PATCH | TRANSDERMAL | Status: AC
  Administered 2020-03-21: 1.5 mg
  Filled 2020-03-21: qty 1

## 2020-03-21 MED ORDER — PROPOFOL 500 MG/50ML IV EMUL
INTRAVENOUS | Status: DC | PRN
  Administered 2020-03-21: 160 mg via INTRAVENOUS

## 2020-03-21 MED ORDER — SCOPOLAMINE 1 MG/3DAYS TD PT72
1.0000 | MEDICATED_PATCH | TRANSDERMAL | Status: DC
  Administered 2020-03-21: 1.5 mg via TRANSDERMAL

## 2020-03-21 MED ORDER — CHLORHEXIDINE GLUCONATE 0.12 % MT SOLN
15.0000 mL | Freq: Once | OROMUCOSAL | Status: AC
  Administered 2020-03-21: 15 mL via OROMUCOSAL

## 2020-03-21 SURGICAL SUPPLY — 21 items
BAG URO CATCHER STRL LF (MISCELLANEOUS) ×3 IMPLANT
BASKET ZERO TIP NITINOL 2.4FR (BASKET) IMPLANT
CATH URET 5FR 28IN OPEN ENDED (CATHETERS) ×3 IMPLANT
CLOTH BEACON ORANGE TIMEOUT ST (SAFETY) ×3 IMPLANT
DRSG TEGADERM 2-3/8X2-3/4 SM (GAUZE/BANDAGES/DRESSINGS) IMPLANT
EXTRACTOR STONE 1.7FRX115CM (UROLOGICAL SUPPLIES) IMPLANT
FIBER LASER FLEXIVA 365 (UROLOGICAL SUPPLIES) IMPLANT
FIBER LASER TRAC TIP (UROLOGICAL SUPPLIES) IMPLANT
GLOVE BIO SURGEON STRL SZ 6.5 (GLOVE) ×2 IMPLANT
GLOVE BIO SURGEONS STRL SZ 6.5 (GLOVE) ×1
GOWN STRL REUS W/TWL LRG LVL3 (GOWN DISPOSABLE) ×6 IMPLANT
GUIDEWIRE STR DUAL SENSOR (WIRE) ×6 IMPLANT
KIT TURNOVER KIT A (KITS) IMPLANT
MANIFOLD NEPTUNE II (INSTRUMENTS) ×3 IMPLANT
SHEATH URETERAL 12FRX28CM (UROLOGICAL SUPPLIES) ×3 IMPLANT
SHEATH URETERAL 12FRX35CM (MISCELLANEOUS) IMPLANT
STENT URET 6FRX22 CONTOUR (STENTS) ×3 IMPLANT
TRAY CYSTO PACK (CUSTOM PROCEDURE TRAY) ×3 IMPLANT
TUBING CONNECTING 10 (TUBING) ×2 IMPLANT
TUBING CONNECTING 10' (TUBING) ×1
TUBING UROLOGY SET (TUBING) ×3 IMPLANT

## 2020-03-21 NOTE — Anesthesia Preprocedure Evaluation (Signed)
Anesthesia Evaluation  Patient identified by MRN, date of birth, ID band Patient awake    Reviewed: Allergy & Precautions, NPO status , Patient's Chart, lab work & pertinent test results  Airway Mallampati: III  TM Distance: >3 FB Neck ROM: Full    Dental no notable dental hx. (+) Teeth Intact   Pulmonary neg pulmonary ROS,    Pulmonary exam normal breath sounds clear to auscultation       Cardiovascular hypertension, negative cardio ROS Normal cardiovascular exam Rhythm:Regular Rate:Normal     Neuro/Psych negative neurological ROS  negative psych ROS   GI/Hepatic negative GI ROS, Neg liver ROS,   Endo/Other  Obesity  Renal/GU Left ureteral calculus  negative genitourinary   Musculoskeletal negative musculoskeletal ROS (+)   Abdominal (+) + obese,   Peds negative pediatric ROS (+)  Hematology  (+) anemia ,   Anesthesia Other Findings   Reproductive/Obstetrics negative OB ROS                             Anesthesia Physical  Anesthesia Plan  ASA: II  Anesthesia Plan: General   Post-op Pain Management:    Induction: Intravenous  PONV Risk Score and Plan: 3 and Ondansetron, Dexamethasone, Midazolam and Treatment may vary due to age or medical condition  Airway Management Planned: LMA  Additional Equipment:   Intra-op Plan:   Post-operative Plan: Extubation in OR  Informed Consent: I have reviewed the patients History and Physical, chart, labs and discussed the procedure including the risks, benefits and alternatives for the proposed anesthesia with the patient or authorized representative who has indicated his/her understanding and acceptance.     Dental advisory given  Plan Discussed with: CRNA and Surgeon  Anesthesia Plan Comments:         Anesthesia Quick Evaluation

## 2020-03-21 NOTE — Transfer of Care (Signed)
Immediate Anesthesia Transfer of Care Note  Patient: Meghan [name]  Procedure(s) Performed: Procedure(s) with comments: CYSTOSCOPY WITH RETROGRADE PYELOGRAM, DIAGNOSTIC URETEROSCOPY AND STENT PLACEMENT (Left) - 1 HR  Patient Location: PACU  Anesthesia Type:General  Level of Consciousness: Alert, Awake, Oriented  Airway & Oxygen Therapy: Patient Spontanous Breathing  Post-op Assessment: Report given to RN  Post vital signs: Reviewed and stable  Last Vitals:  Vitals:   03/21/20 0735  BP: 121/64  Pulse: 83  Resp: 18  Temp: 36.6 C  SpO2: 98%    Complications: No apparent anesthesia complications

## 2020-03-21 NOTE — Op Note (Signed)
Preoperative diagnosis: left ureteral calculus  Postoperative diagnosis: normal ureter/kidney  Procedure:  1. Cystoscopy 2. left diagnostic ureteroscopy 3. left 57F x 22 ureteral stent placement  4. left retrograde pyelography with interpretation  Surgeon: Kasandra Knudsen, MD  Anesthesia: General  Complications: None  Intraoperative findings: left retrograde pyelography demonstrated a normal ureter and kidney with appropriate ureteral dilation from ureteral stent  EBL: Minimal  Specimens: 1. none  Disposition of specimens: [facility] for stone analysis  Indication: Meghan [name] is a 49 y.o.   patient with a 4 mm left ureteral stone and associated left symptoms.  Patient initially presented with fever and clinical signs of sepsis and underwent emergent ureteral stent placement on 03/03/2028.  Patient now returns for definitive treatment of stone.  After reviewing the management options for treatment, the patient elected to proceed with the above surgical procedure(s). We have discussed the potential benefits and risks of the procedure, side effects of the proposed treatment, the likelihood of the patient achieving the goals of the procedure, and any potential problems that might occur during the procedure or recuperation. Informed consent has been obtained.   Description of procedure:  The patient was taken to the operating room and general anesthesia was induced.  The patient was placed in the dorsal lithotomy position, prepped and draped in the usual sterile fashion, and preoperative antibiotics were administered. A preoperative time-out was performed.   Cystourethroscopy was performed.  The patient's urethra was examined and was normal.  The bladder was then systematically examined in its entirety. There was no evidence for any bladder tumors, stones, or other mucosal pathology.    The existing left ureteral stent was grabbed with a grasper and brought to the  urethral meatus.  A sensor wire was then used to cannulate the stent and advanced to the kidney with fluoroscopic guidance.  The ureteral catheter was then advanced over the wire into the distal ureter.  Omnipaque contrast was injected through the ureteral catheter and a retrograde pyelogram was performed with findings as dictated above.  A 0.38 sensor guidewire was then advanced up the left ureter into the renal pelvis under fluoroscopic guidance. The 6 Fr semirigid ureteroscope was then advanced into the ureter next to the guidewire up to the level of the UPJ.  No stone was encountered.  A second wire was then advanced through the ureteroscope and the ureteroscope was removed.  A ureteral access sheath was then placed over the working wire and advanced to the kidney with fluoroscopic guidance.  The inner sheath and wire removed.  Flexible ureteroscopy then took place with inspection of upper middle and lower pole calyx.  No stones were encountered.  The access sheath was then removed and unison with the ureteroscope taking care to examine the ureter on the way out.  There is no damage or trauma noted in the ureter.  No stones were seen.  The wire was then backloaded through the cystoscope and a ureteral stent was advance over the wire using Seldinger technique.  The stent was positioned appropriately under fluoroscopic and cystoscopic guidance.  The wire was then removed with an adequate stent curl noted in the renal pelvis as well as in the bladder.  The bladder was then emptied and the procedure ended.  The patient appeared to tolerate the procedure well and without complications.  The patient was able to be awakened and transferred to the recovery unit in satisfactory condition.   Disposition: The tether of the stent was left  on and tucked inside the patient's vagina.  Instructions for removing the stent have been provided to the patient.  She will have follow-up in approximately 3 weeks in the office.   If she is experiencing postop pain we will proceed with renal ultrasound to ensure resolution of hydronephrosis however low suspicion due to absence of stone.

## 2020-03-21 NOTE — Discharge Instructions (Signed)
DISCHARGE INSTRUCTIONS FOR KIDNEY STONE/URETERAL STENT   MEDICATIONS:  1. Resume all your other meds from home - except do not take any extra narcotic pain meds that you may have at home.  2. Pyridium is to help with the burning/stinging when you urinate. 3. Oxycodone-acetaminophen is for moderate/severe pain, otherwise taking upto 1000 mg every 6 hours of plainTylenol will help treat your pain.   4. Take Cipro one hour prior to removal of your stent.    ACTIVITY:  1. No strenuous activity x 1week  2. No driving while on narcotic pain medications  3. Drink plenty of water  4. Continue to walk at home - you can still get blood clots when you are at home, so keep active, but don't over do it.  5. May return to work/school tomorrow or when you feel ready   BATHING:  1. You can shower and we recommend daily showers  2. You have a string coming from your urethra: The stent string is attached to your ureteral stent. Do not pull on this.   SIGNS/SYMPTOMS TO CALL:  Please call us if you have a fever greater than 101.5, uncontrolled nausea/vomiting, uncontrolled pain, dizziness, unable to urinate, bloody urine, chest pain, shortness of breath, leg swelling, leg pain, redness around wound, drainage from wound, or any other concerns or questions.   You can reach [place] at [phone].   FOLLOW-UP:  1. You have a string attached to your stent, you may remove it on Monday, June 28. To do this, pull the strings until the stents are completely removed. You may feel an odd sensation in your back.

## 2020-03-21 NOTE — Anesthesia Procedure Notes (Signed)
Procedure Name: LMA Insertion Date/Time: 03/21/2020 9:13 AM Performed by: Basilio Cairo, CRNA Pre-anesthesia Checklist: Patient identified, Patient being monitored, Timeout performed, Emergency Drugs available and Suction available Patient Re-evaluated:Patient Re-evaluated prior to induction Oxygen Delivery Method: Circle system utilized Preoxygenation: Pre-oxygenation with 100% oxygen Induction Type: IV induction Ventilation: Mask ventilation without difficulty LMA: LMA with gastric port inserted LMA Size: 4.0 Tube type: Oral Number of attempts: 1 Placement Confirmation: positive ETCO2 and breath sounds checked- equal and bilateral Tube secured with: Tape Dental Injury: Teeth and Oropharynx as per pre-operative assessment

## 2020-03-21 NOTE — Interval H&P Note (Signed)
History and Physical Interval Note: Patient underwent emergent left ureteral stent placement on 03/03/20 due to obstructing stone with sepsis.  Patient was treated with culture directed antibiotics and now returns for definitive stone treatment.  Repeat urine culture without bacterial growth.   03/21/2020 8:25 AM  Meghan [name]  has presented today for surgery, with the diagnosis of LEFT URETERAL CALCULUS.  The various methods of treatment have been discussed with the patient and family. After consideration of risks, benefits and other options for treatment, the patient has consented to  Procedure(s) with comments: CYSTOSCOPY WITH RETROGRADE PYELOGRAM, URETEROSCOPY AND STENT PLACEMENT (Left) - 1 HR HOLMIUM LASER APPLICATION (Left) as a surgical intervention.  The patient's history has been reviewed, patient examined, no change in status, stable for surgery.  I have reviewed the patient's chart and labs.  Questions were answered to the patient's satisfaction.     Meghan [name]

## 2020-03-21 NOTE — Anesthesia Postprocedure Evaluation (Signed)
Anesthesia Post Note  Patient: Meghan [name]  Procedure(s) Performed: CYSTOSCOPY WITH RETROGRADE PYELOGRAM, DIAGNOSTIC URETEROSCOPY AND STENT PLACEMENT (Left )     Patient location during evaluation: PACU Anesthesia Type: General Level of consciousness: awake and alert and oriented Pain management: pain level controlled Vital Signs Assessment: post-procedure vital signs reviewed and stable Respiratory status: spontaneous breathing, nonlabored ventilation and respiratory function stable Cardiovascular status: blood pressure returned to baseline and stable Postop Assessment: no apparent nausea or vomiting Anesthetic complications: no   No complications documented.  Last Vitals:  Vitals:   03/21/20 1000 03/21/20 1015  BP: 129/76 (!) 142/79  Pulse: 88 91  Resp: 16 18  Temp:    SpO2: 100% 95%    Last Pain:  Vitals:   03/21/20 0946  TempSrc:   PainSc: 0-No pain                 Meghan [name] A.

## 2020-03-22 ENCOUNTER — Encounter (HOSPITAL_COMMUNITY): Payer: Self-pay | Admitting: Urology

## 2021-04-23 IMAGING — RF DG CYSTOGRAM 3+V
1 series · 1 of 1 positions shown · non-contrast
Comparison: CT, 03/02/2020

CLINICAL DATA: Fluoroscopy provided for cystoscopy and stent
placement.

EXAM:
CYSTOGRAM - 3+ VIEW

[Series 1: run · 1 of 1 slices shown]
[im 1/1]
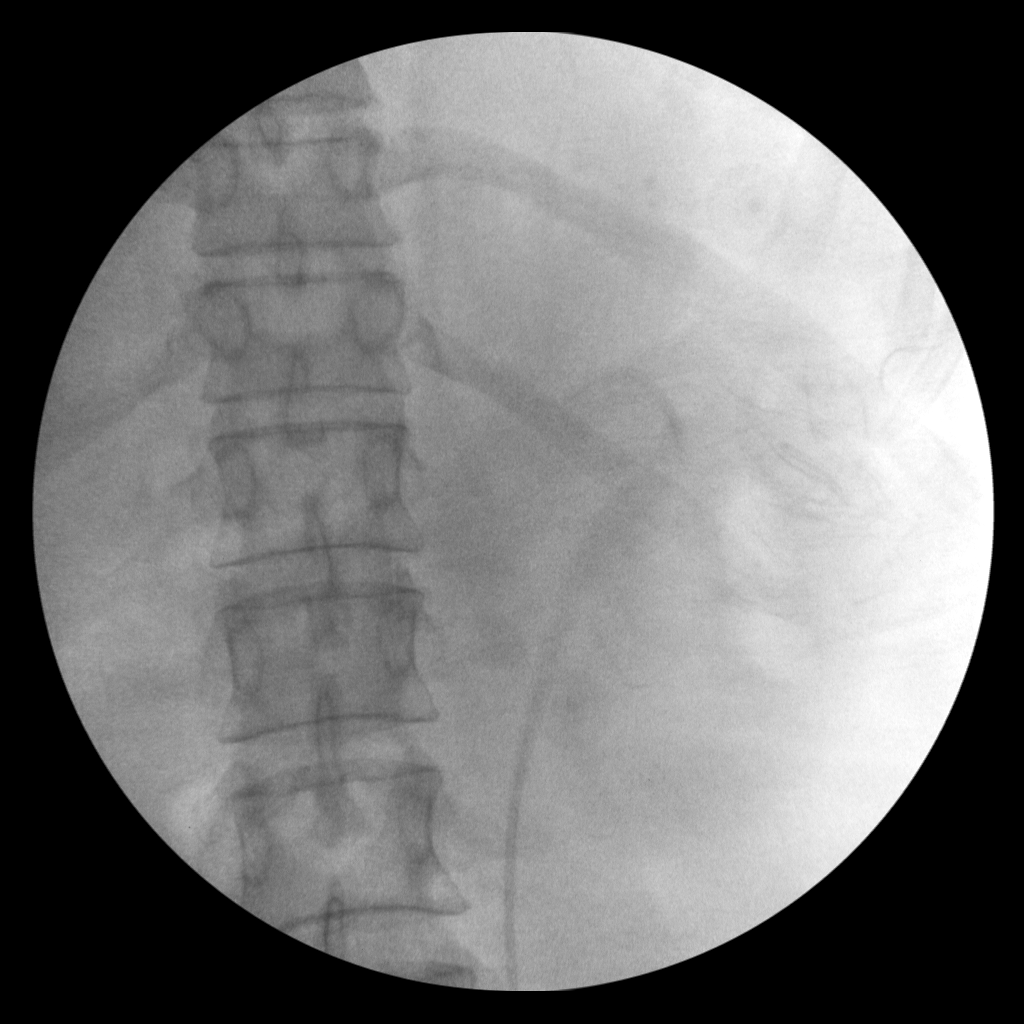

[1 of 1 positions shown; findings below may reference images not displayed]

FINDINGS: Single submitted image shows a stent extending through the left
ureter.
IMPRESSION: Portable fluoroscopic imaging provided for cystoscopy and stent
placement
